# Patient Record
Sex: Female | Born: 1955 | Race: White | Hispanic: No | Marital: Single | Smoking: Former smoker
Health system: Southern US, Academic
[De-identification: ages and names within clinical notes are randomized; demographics above are authoritative.]

## PROBLEM LIST (undated history)

## (undated) DIAGNOSIS — M25562 Pain in left knee: Secondary | ICD-10-CM

## (undated) DIAGNOSIS — F1020 Alcohol dependence, uncomplicated: Secondary | ICD-10-CM

## (undated) DIAGNOSIS — M5020 Other cervical disc displacement, unspecified cervical region: Secondary | ICD-10-CM

## (undated) DIAGNOSIS — E538 Deficiency of other specified B group vitamins: Secondary | ICD-10-CM

## (undated) DIAGNOSIS — L989 Disorder of the skin and subcutaneous tissue, unspecified: Secondary | ICD-10-CM

## (undated) DIAGNOSIS — N959 Unspecified menopausal and perimenopausal disorder: Secondary | ICD-10-CM

## (undated) DIAGNOSIS — I8390 Asymptomatic varicose veins of unspecified lower extremity: Secondary | ICD-10-CM

## (undated) DIAGNOSIS — E559 Vitamin D deficiency, unspecified: Secondary | ICD-10-CM

## (undated) DIAGNOSIS — I480 Paroxysmal atrial fibrillation: Secondary | ICD-10-CM

## (undated) DIAGNOSIS — M25561 Pain in right knee: Secondary | ICD-10-CM

## (undated) DIAGNOSIS — R739 Hyperglycemia, unspecified: Secondary | ICD-10-CM

## (undated) DIAGNOSIS — F411 Generalized anxiety disorder: Secondary | ICD-10-CM

## (undated) DIAGNOSIS — G2581 Restless legs syndrome: Secondary | ICD-10-CM

## (undated) DIAGNOSIS — I10 Essential (primary) hypertension: Secondary | ICD-10-CM

## (undated) DIAGNOSIS — F32A Depression, unspecified: Secondary | ICD-10-CM

## (undated) HISTORY — PX: MOUTH SURGERY: SHX715

## (undated) HISTORY — DX: Unspecified menopausal and perimenopausal disorder: N95.9

## (undated) HISTORY — DX: Vitamin D deficiency, unspecified: E55.9

## (undated) HISTORY — DX: Restless legs syndrome: G25.81

## (undated) HISTORY — DX: Depression, unspecified: F32.A

## (undated) HISTORY — DX: Deficiency of other specified B group vitamins: E53.8

## (undated) HISTORY — DX: Other cervical disc displacement, unspecified cervical region: M50.20

## (undated) HISTORY — DX: Hypomagnesemia: E83.42

## (undated) HISTORY — PX: DENTAL SURGERY: SHX609

## (undated) HISTORY — DX: Pain in right knee: M25.561

## (undated) HISTORY — DX: Disorder of the skin and subcutaneous tissue, unspecified: L98.9

## (undated) HISTORY — DX: Alcohol dependence, uncomplicated: F10.20

## (undated) HISTORY — DX: Asymptomatic varicose veins of unspecified lower extremity: I83.90

## (undated) HISTORY — DX: Paroxysmal atrial fibrillation: I48.0

## (undated) HISTORY — DX: Generalized anxiety disorder: F41.1

## (undated) HISTORY — DX: Essential (primary) hypertension: I10

## (undated) HISTORY — DX: Hyperglycemia, unspecified: R73.9

## (undated) HISTORY — DX: Pain in left knee: M25.562

---

## 1994-08-06 ENCOUNTER — Other Ambulatory Visit (HOSPITAL_COMMUNITY): Payer: Self-pay | Admitting: OBSTETRICS/GYNECOLOGY

## 2020-02-28 IMAGING — CR XRAY LUMBAR SPINE COMPLETE INCLUDING BENDING VIEWS
1 series · 5 of 5 positions shown · non-contrast
Comparison: None available.

﻿EXAM:  XRAY LUMBAR SPINE COMPLETE INCLUDING BENDING VIEWS
INDICATION: Lower back pain.

[Series 1: view not recorded · 0.17mm/px · 5 of 5 slices shown]
[im 1/5]
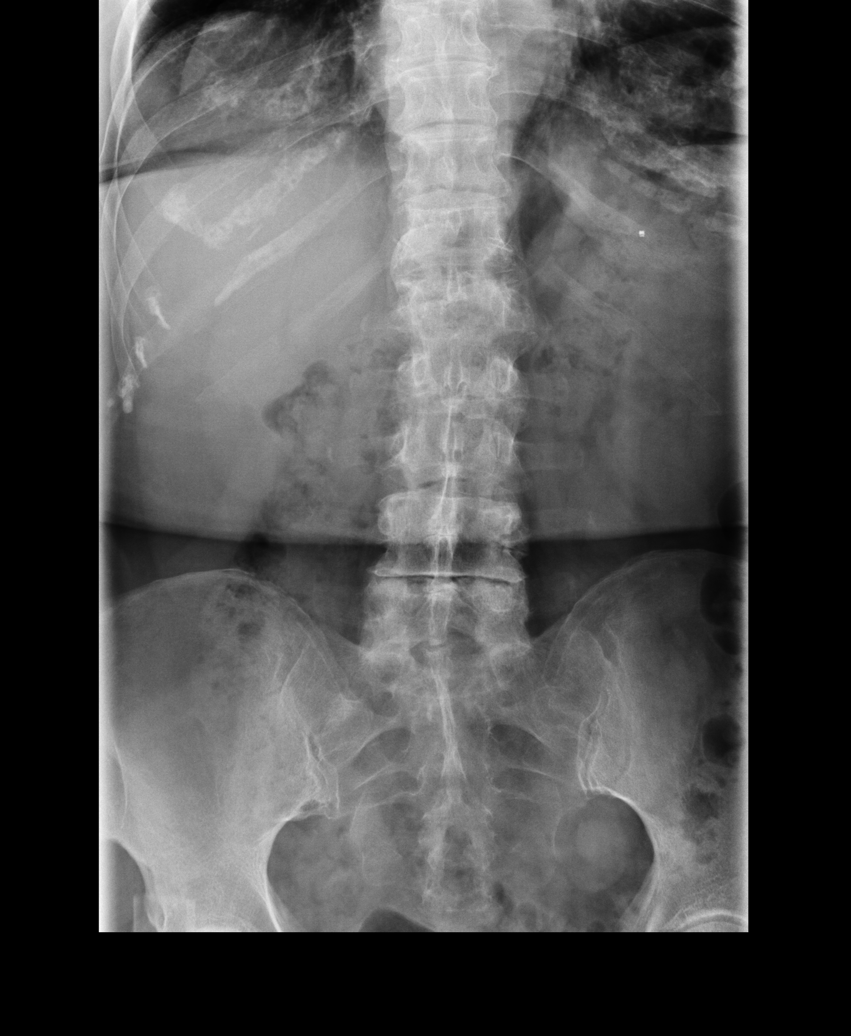
[im 2/5]
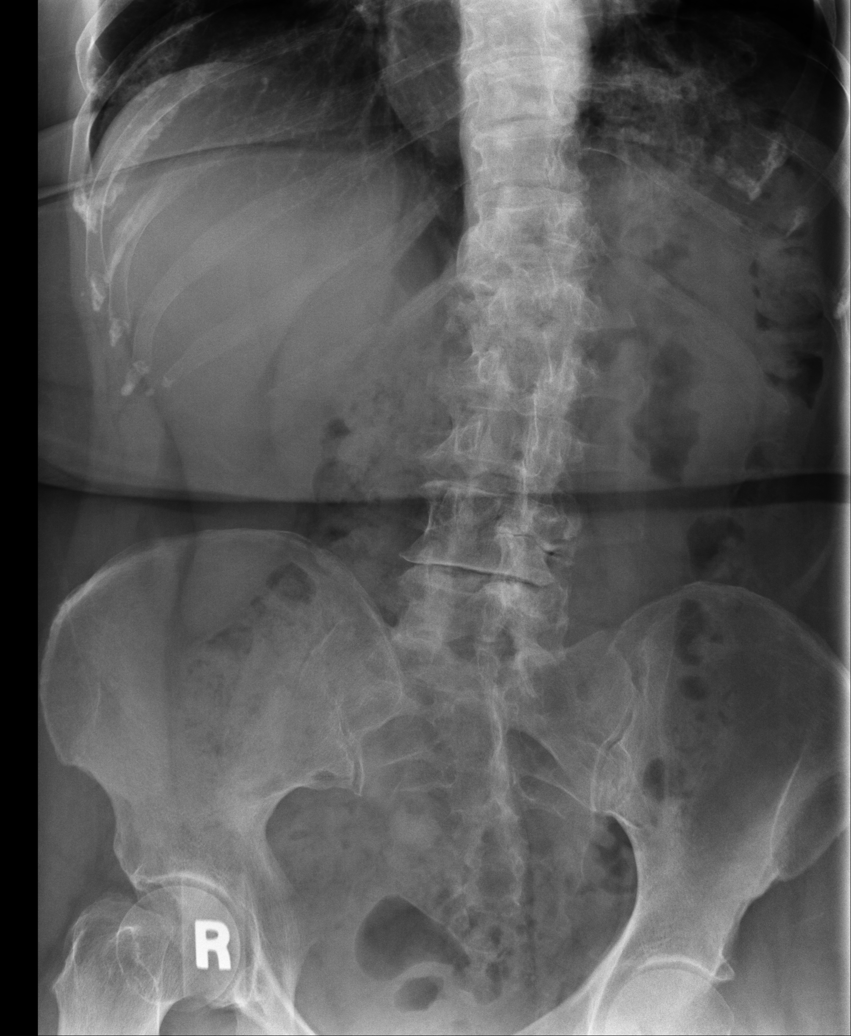
[im 3/5]
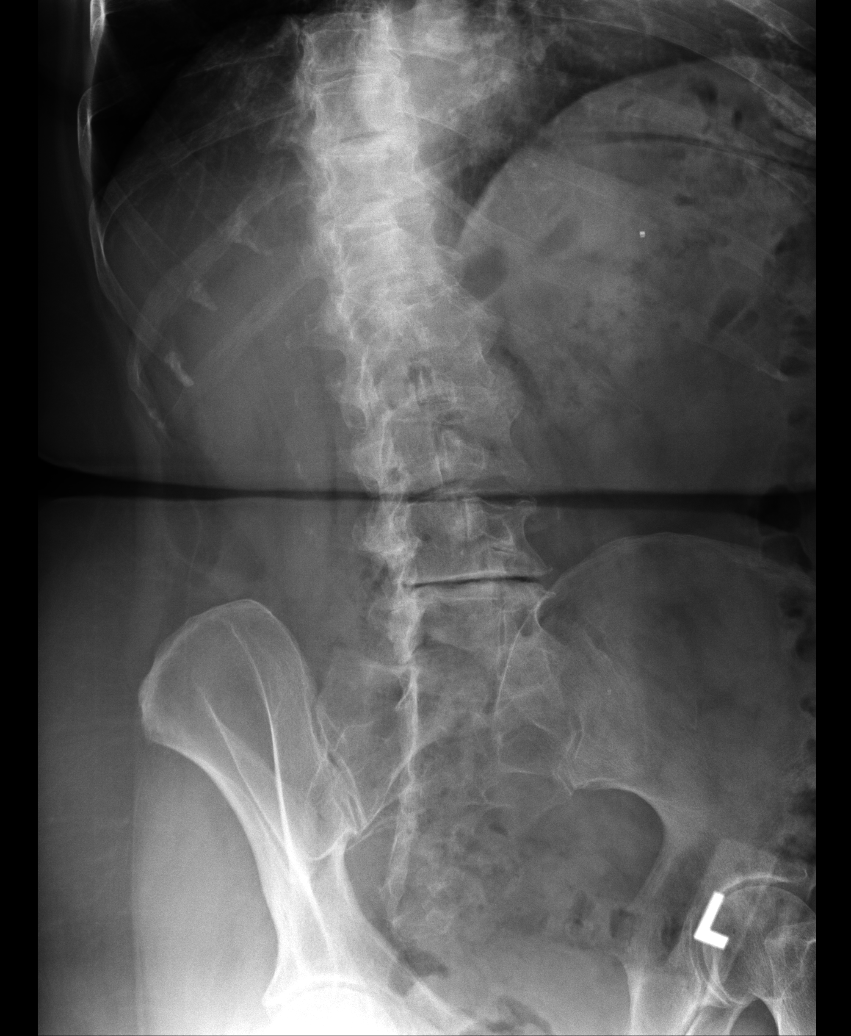
[im 4/5]
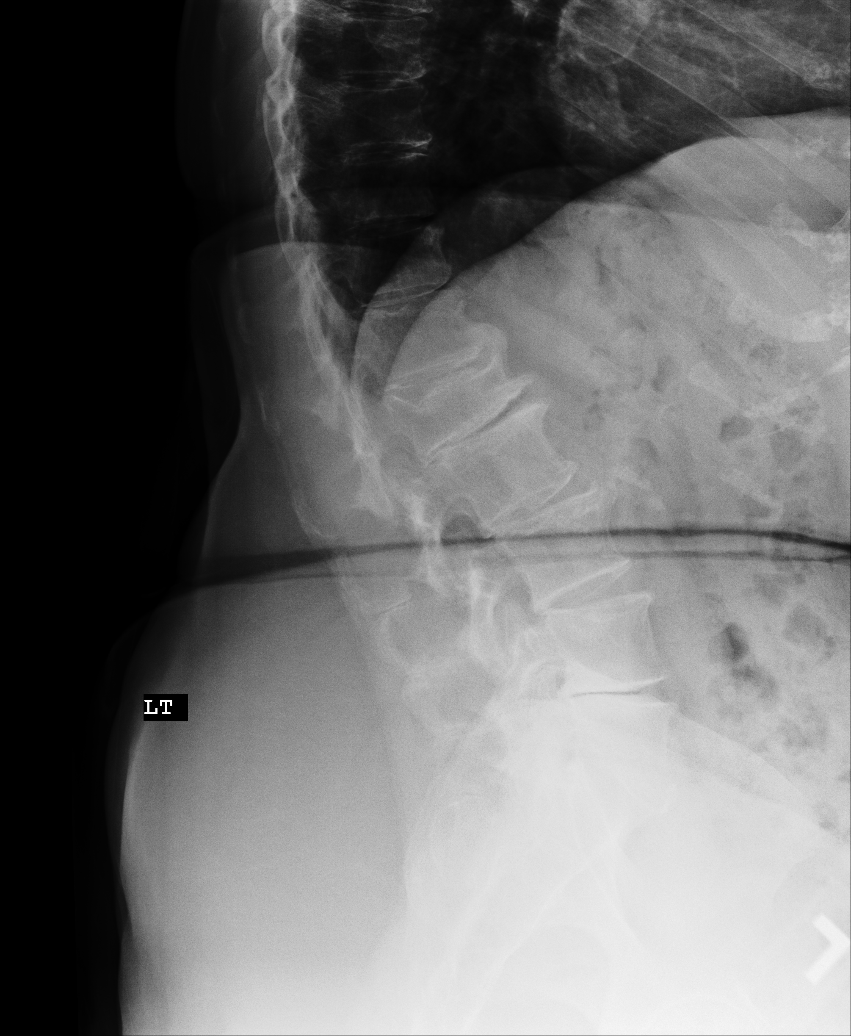
[im 5/5]
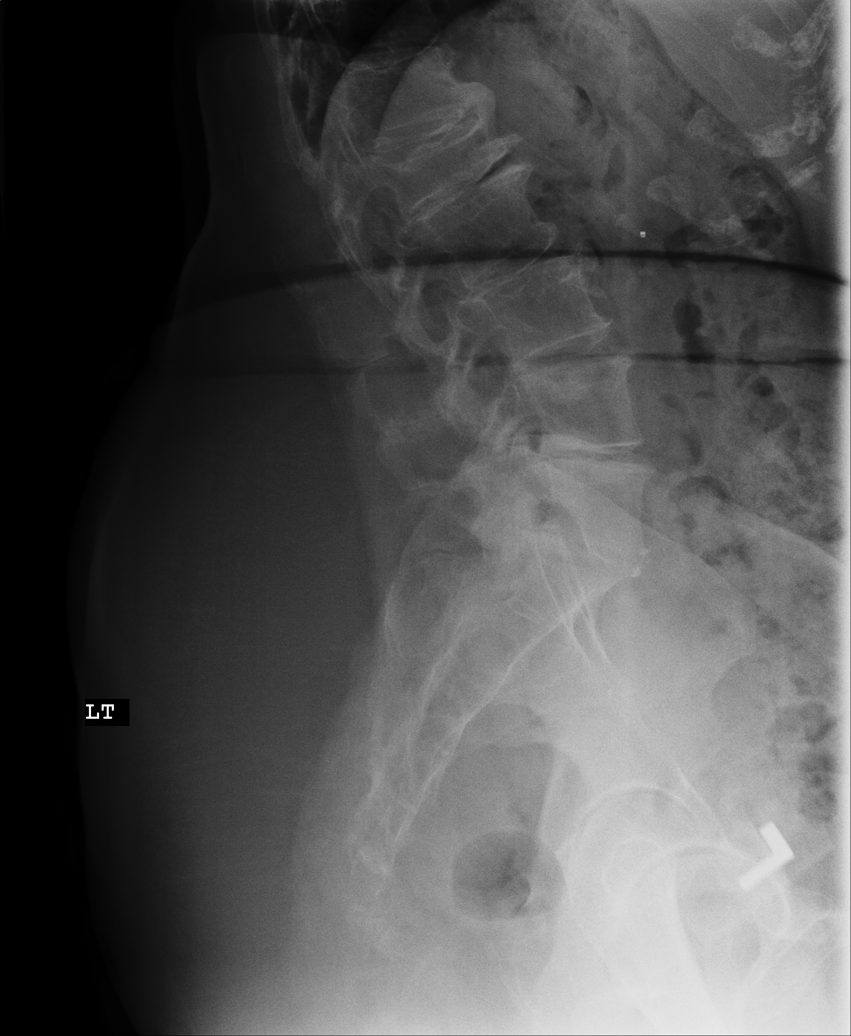

[5 of 5 positions shown; findings below may reference images not displayed]

FINDINGS: There is a moderate chronic L1 compression fracture. No acute fracture or subluxation is seen. There is severe degenerative disc disease at L1-2, L4-5 and L5-S1 levels. There is also moderate facet arthropathy within the lower lumbar spine. Paraspinal soft tissues are unremarkable.
IMPRESSION: Multilevel degenerative changes and chronic L1 compression fracture.

## 2020-02-28 IMAGING — CR XRAY THORACIC SPINE 2 VIEWS
1 series · 3 of 3 positions shown · non-contrast
Comparison: None available.

﻿EXAM:  14414 - X-RAY EXAM THORAC SPINE 2VWS
INDICATION: Back pain.

[Series 1: view not recorded · 0.17mm/px · 3 of 3 slices shown]
[im 1/3]
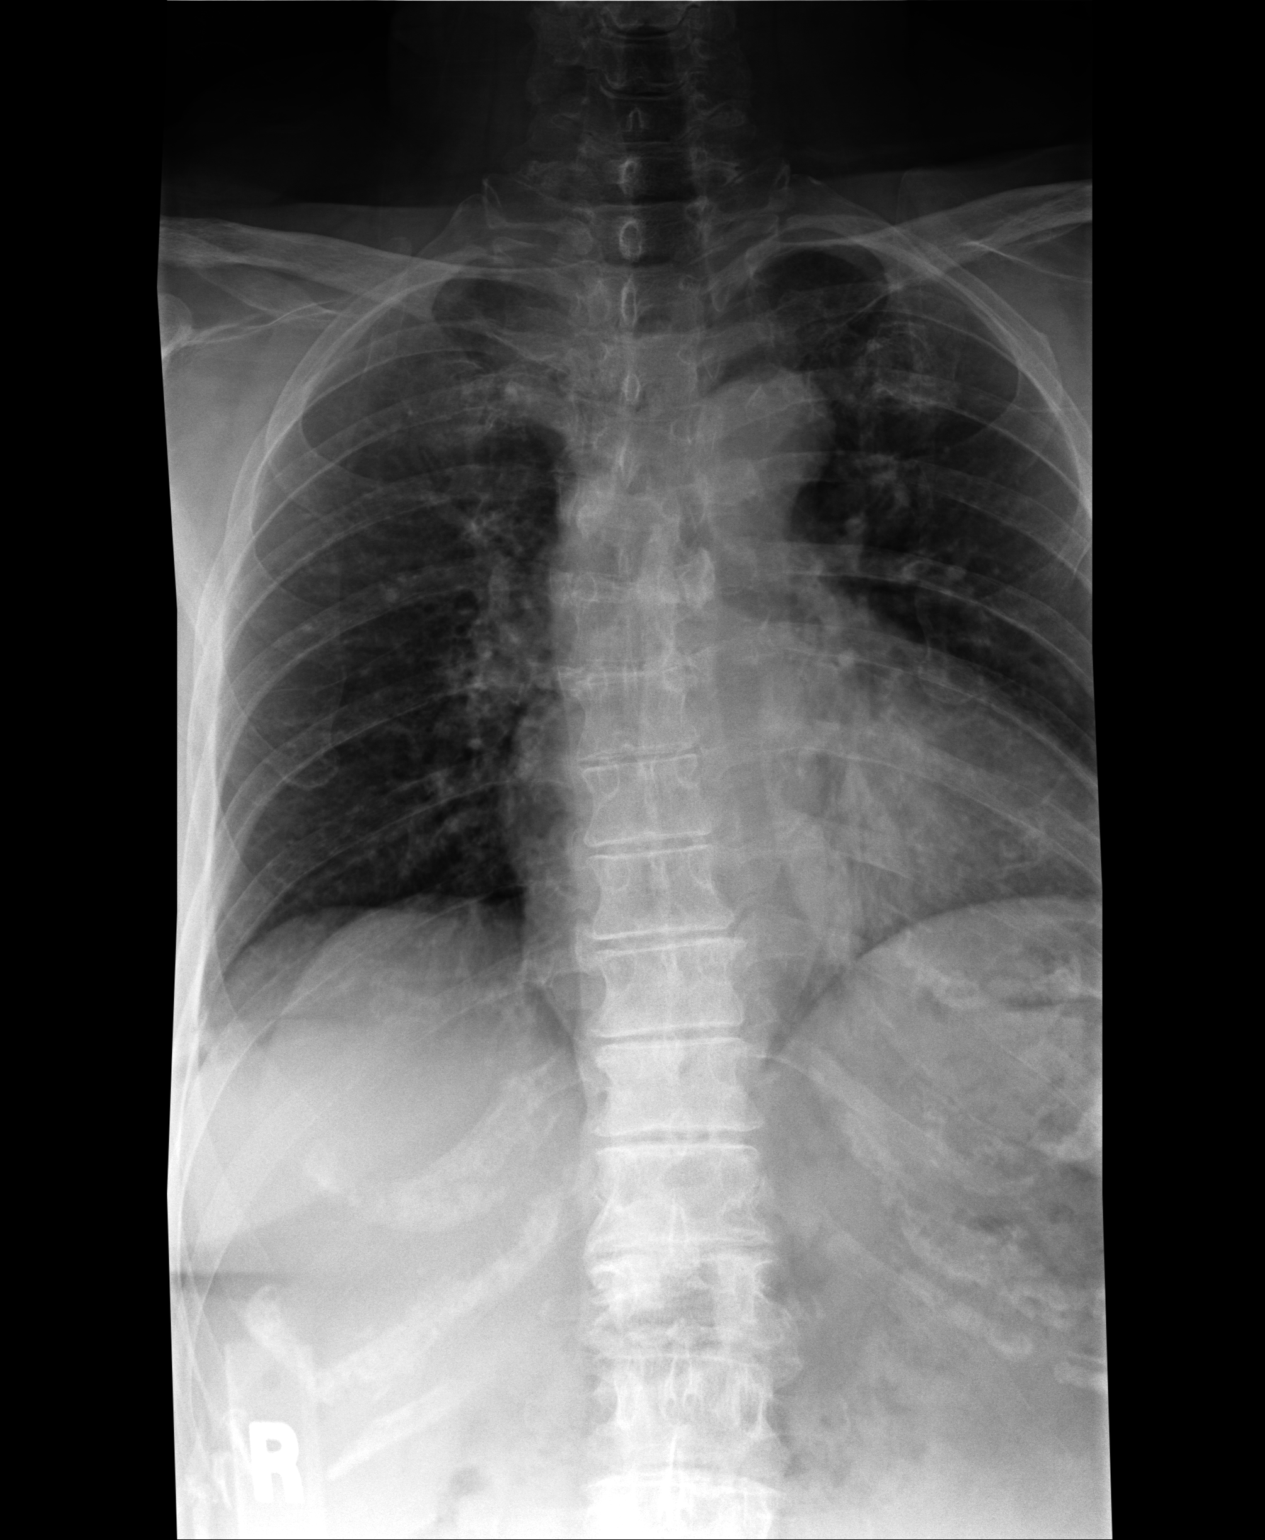
[im 2/3]
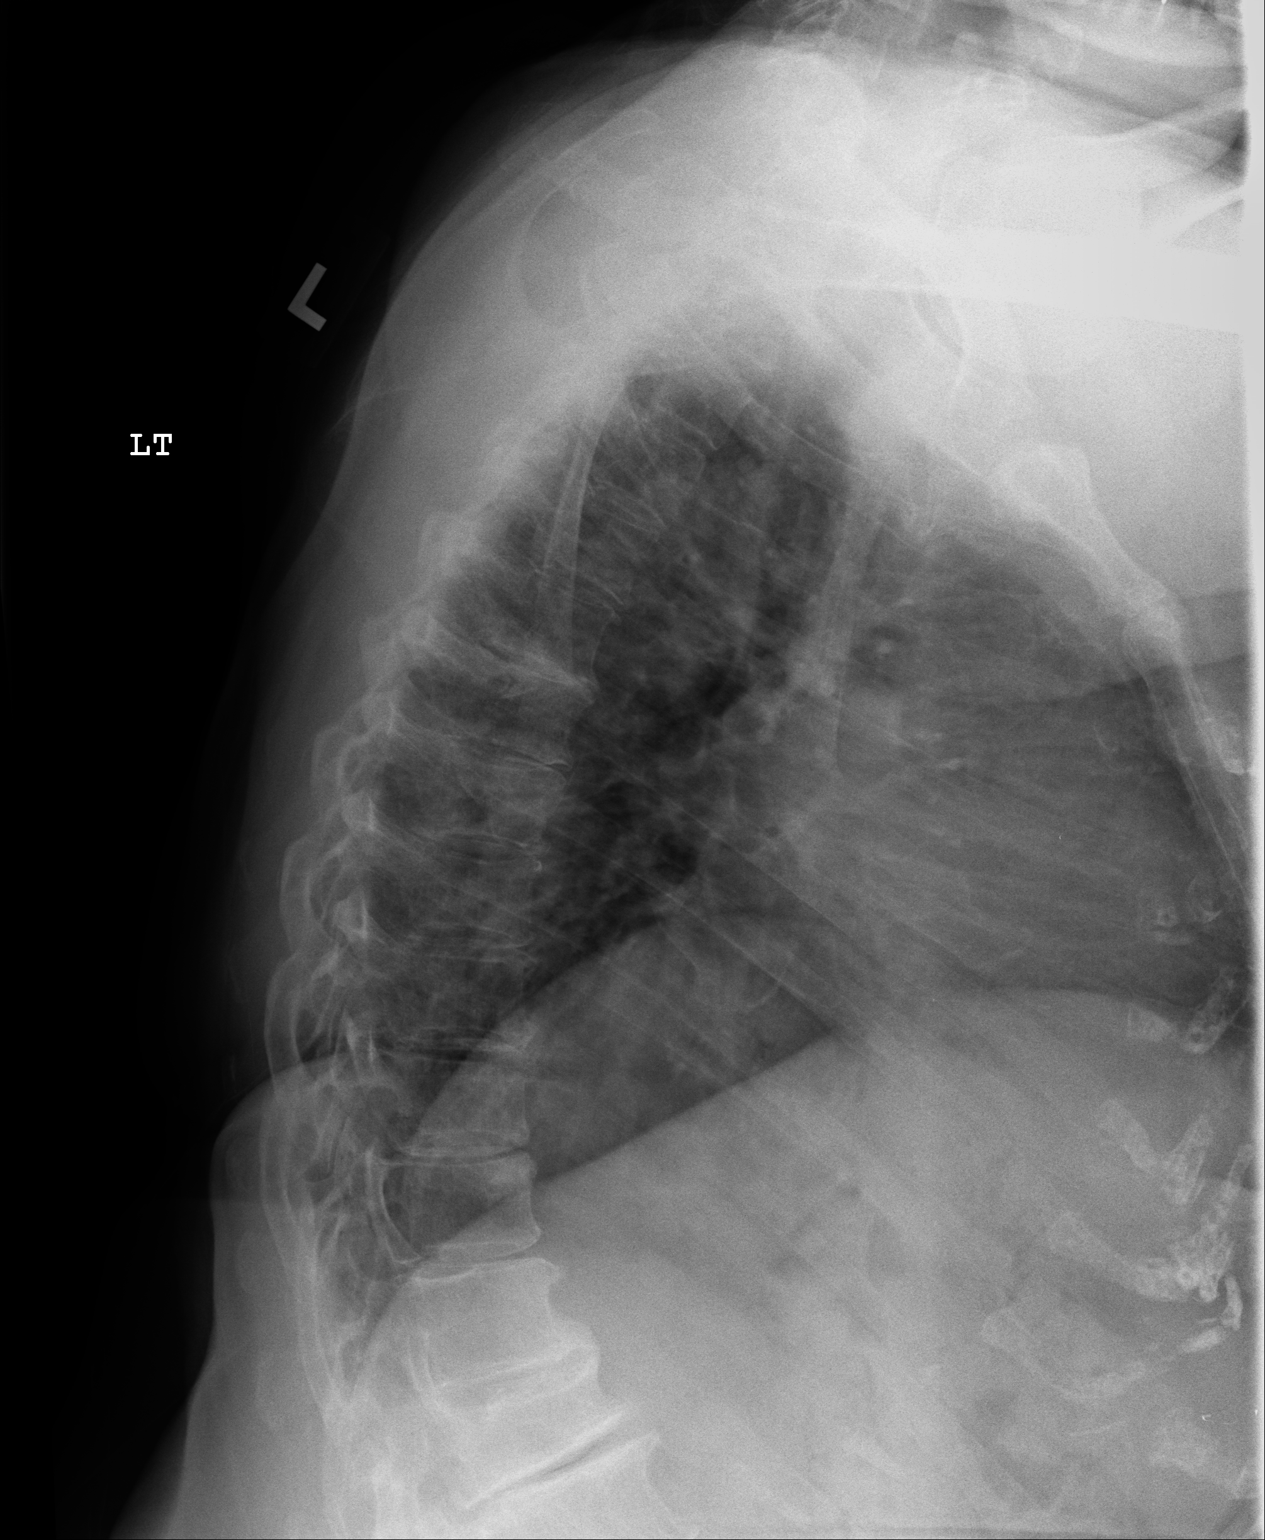
[im 3/3]
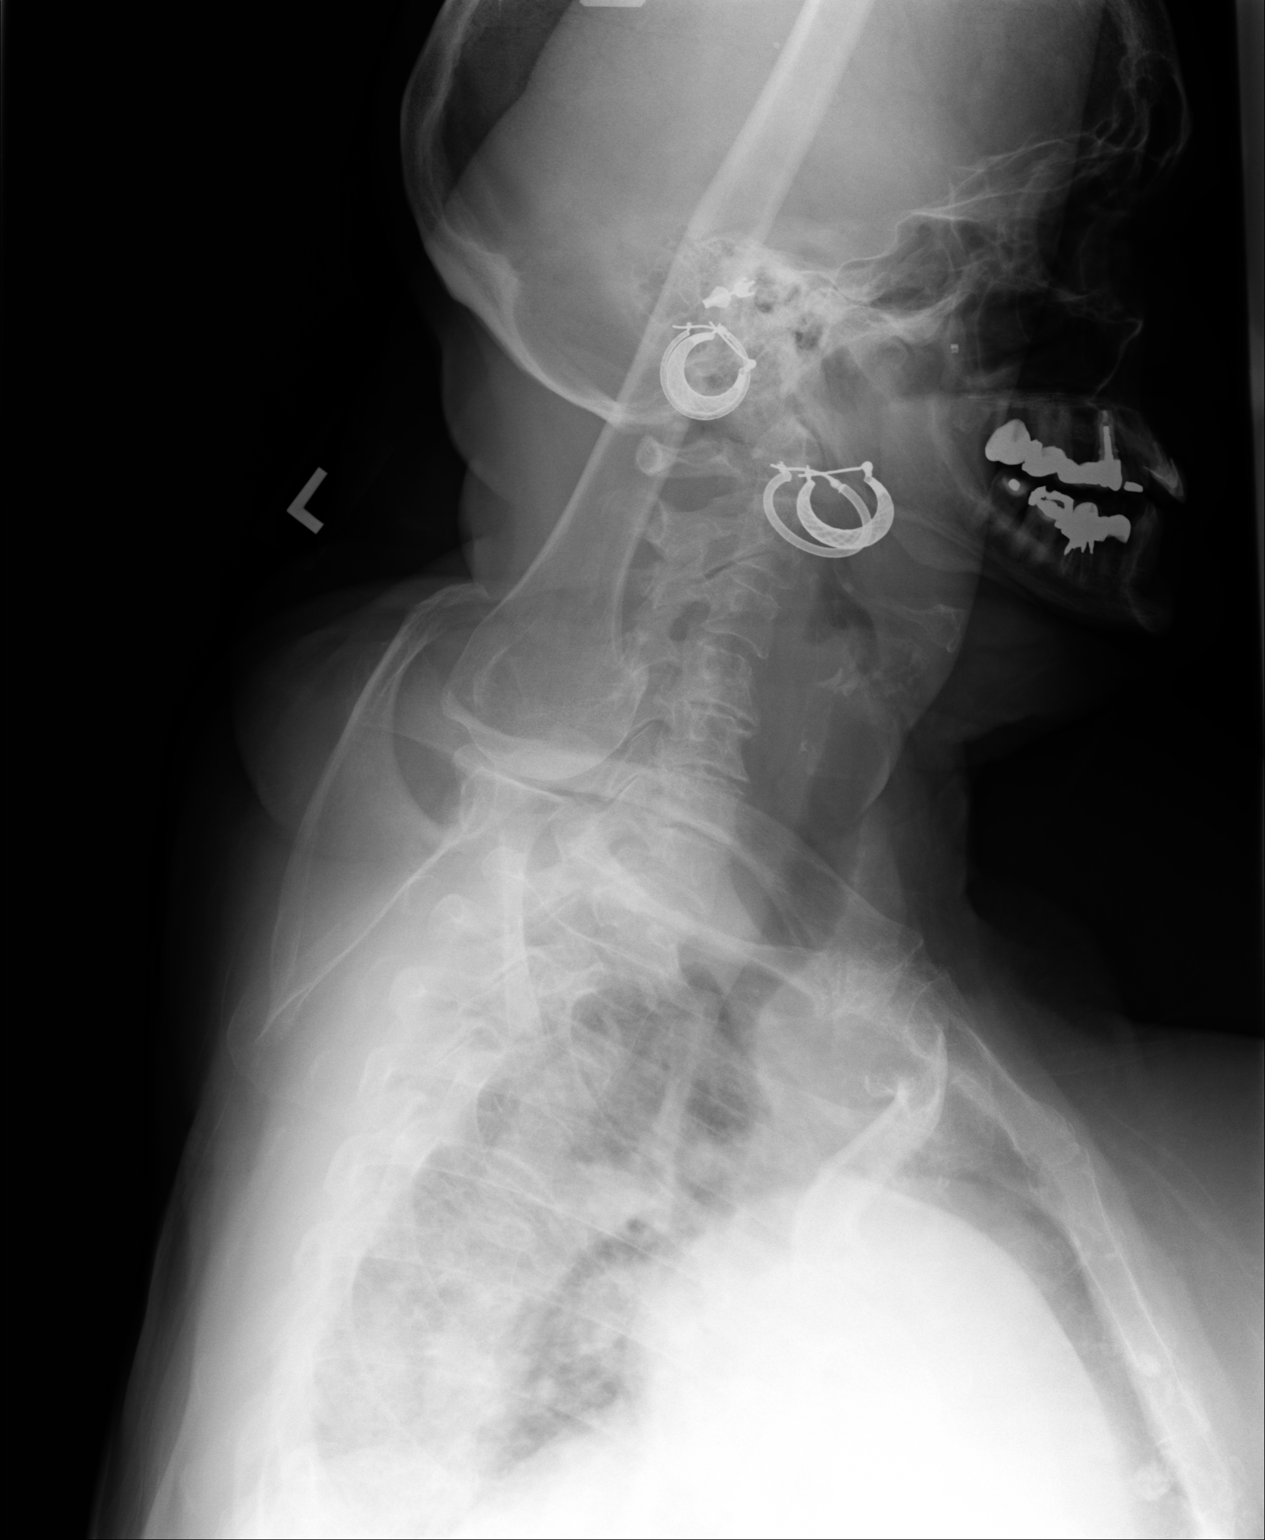

[3 of 3 positions shown; findings below may reference images not displayed]

FINDINGS: Vertebral bodies are normal in height and alignment. There is no acute fracture or subluxation. There is moderate to severe degenerative disc disease at T5-6 and T12-L1 levels. Paraspinal soft tissues are unremarkable. Visualized lung fields are clear.
IMPRESSION: Multilevel degenerative changes as detailed above.

## 2020-04-23 IMAGING — MG 3D SCREENING MAMMO BIL W/CAD
5 series · 7 of 24 positions shown · non-contrast
Comparison: 05/11/2021

------------- REPORT [id number] -------------
﻿

A-JAY, SHITONGENI
EXAM:  3D BILATERAL ANNUAL SCREENING DIGITAL MAMMOGRAM WITH CAD AND TOMOSYNTHESIS
INDICATION: Screening.

[R]
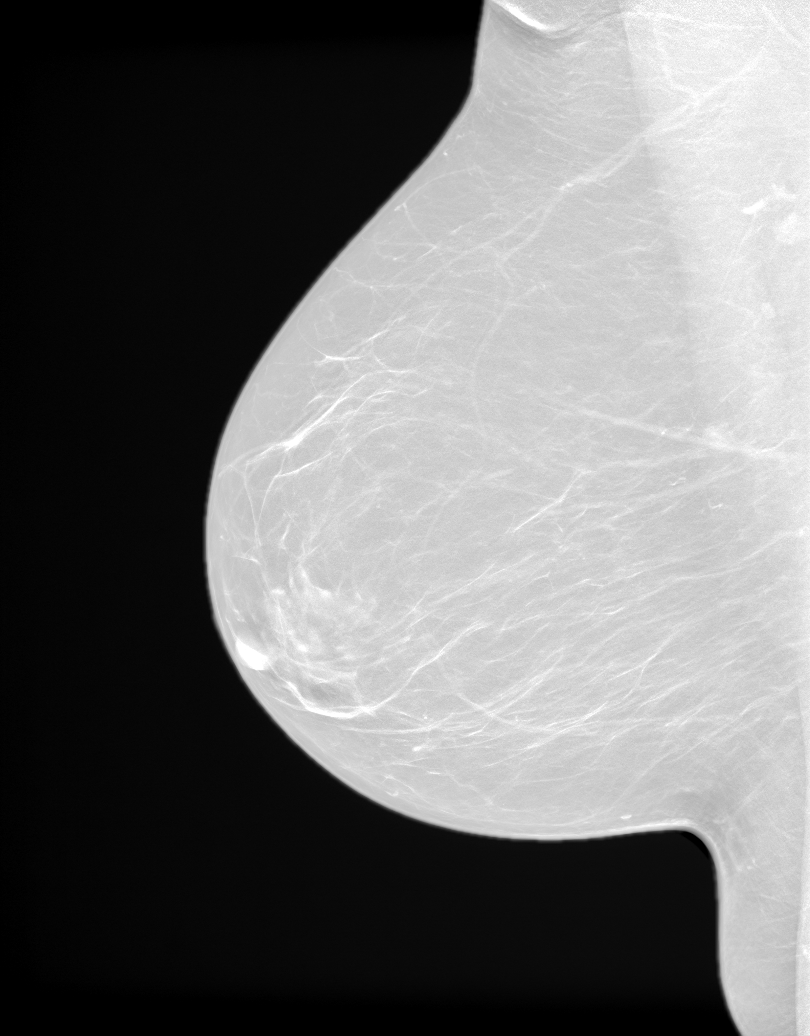

[L]
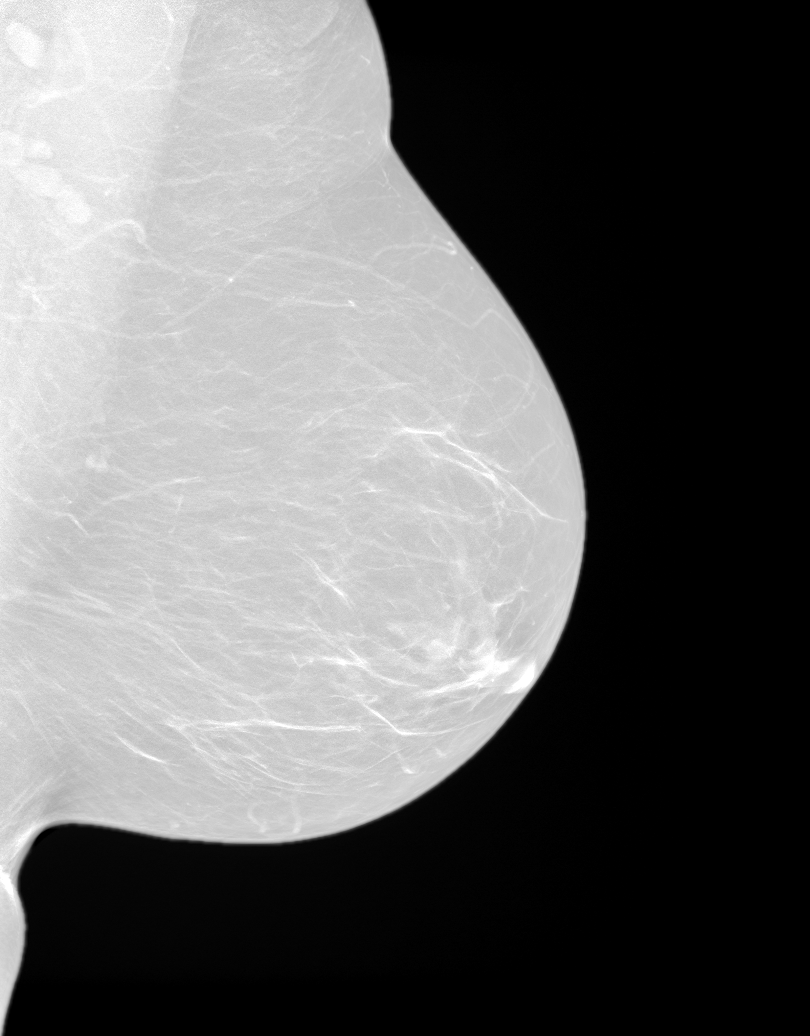

[Series 7611: R CC · right · 0.10mm/px · 2 of 2 slices shown]
[im 1/2]
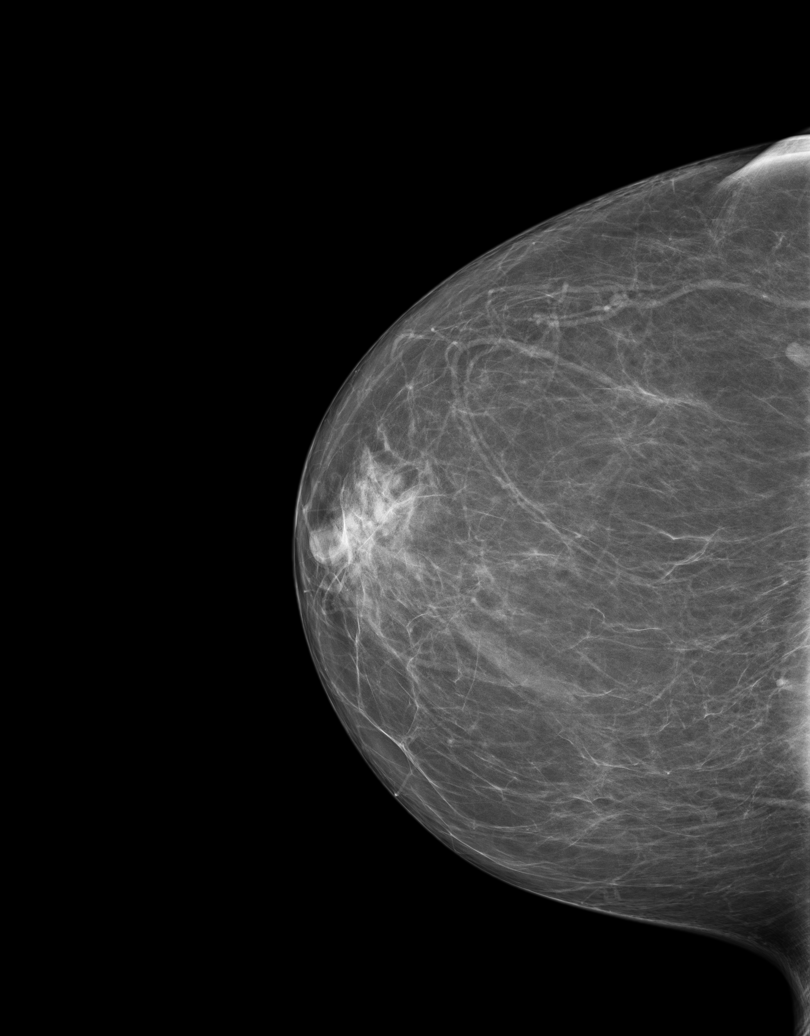
[im 2/2]
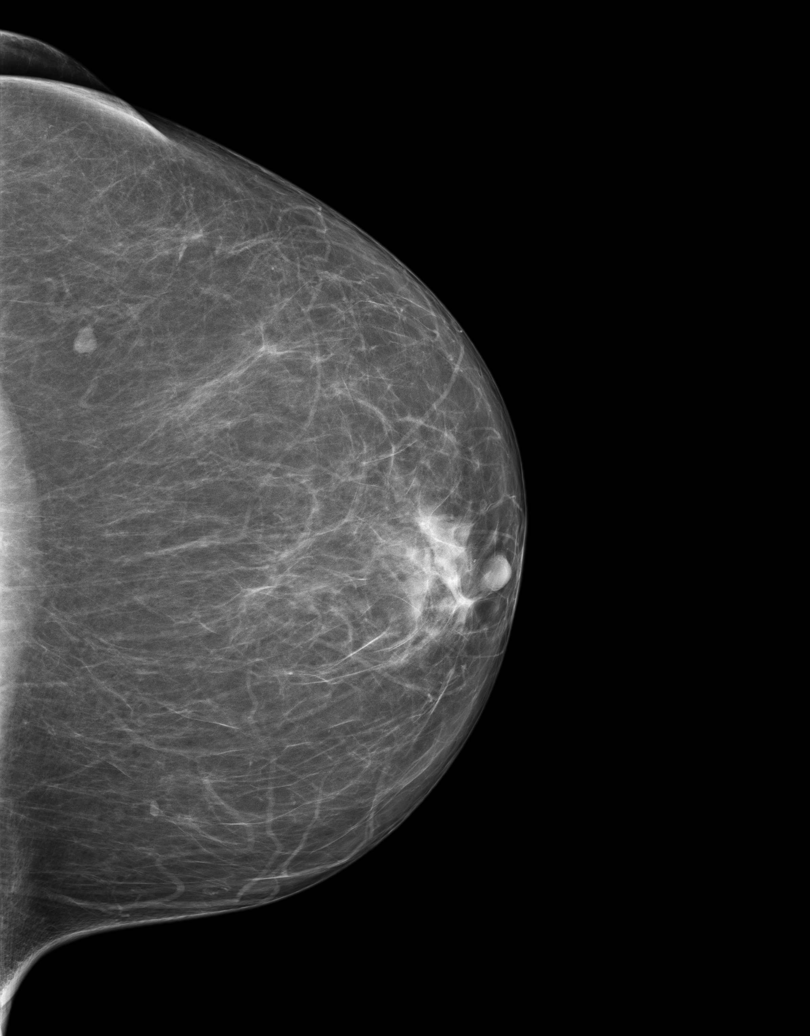

[Series 7613: 3D SCREENING MAMMO BIL W/CAD · 2 acquisitions, 2 frames shown (1 of 2)]
[im 1/2]
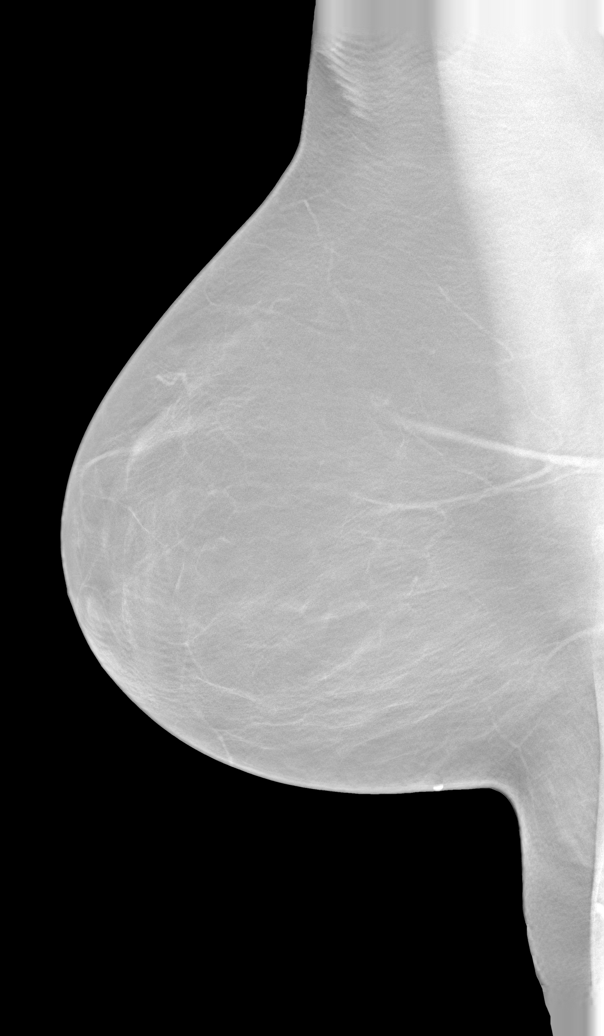
[im 2/2]
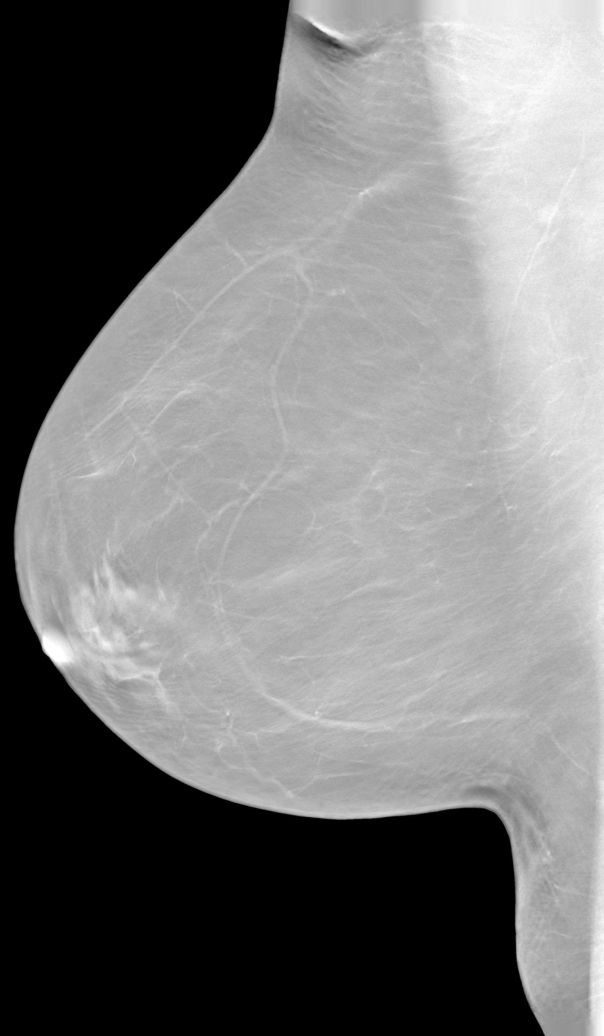

[3D SCREENING MAMMO BIL W/CAD (2 of 2) · tomo slice 13/78.0]
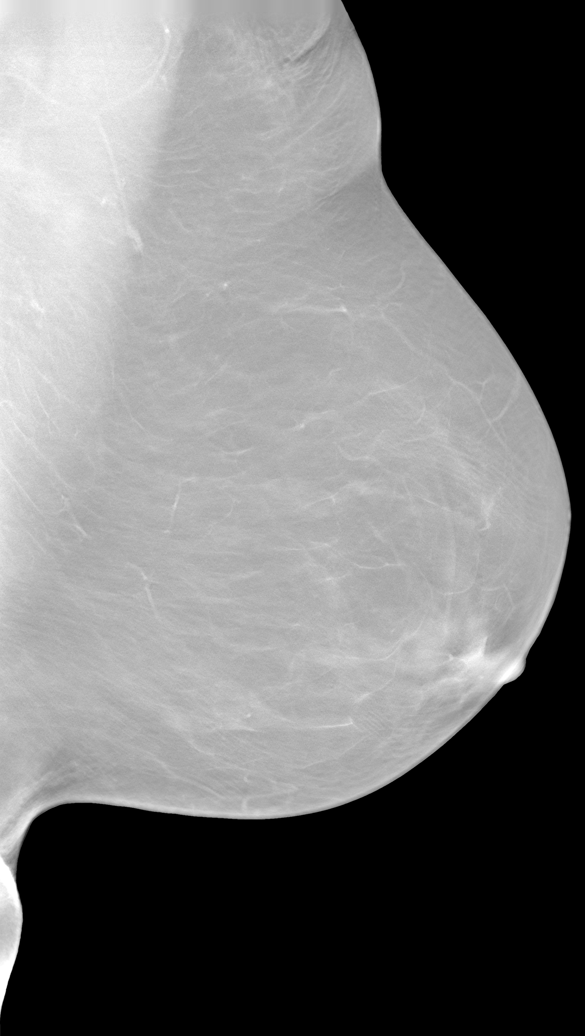

[7 of 24 positions shown; findings below may reference images not displayed]

FINDINGS: There are scattered fibroglandular elements.  There is no mass or suspicious cluster of microcalcifications.   There is no architectural distortion, skin thickening or nipple retraction.
IMPRESSION: 1.  BIRADS 2-Benign findings. Patient has been added in a reminder system with a target date for the next screening mammography.

2.  DENSITY CODE – B (Scattered areas of fibroglandular density). 

Final Assessment Code:

Bi-Rads 2 

BI-RADS 0
 Need additional imaging evaluation.

BI-RADS 1
 Negative mammogram.

BI-RADS 2
 Benign finding.

BI-RADS 3
 Probably benign finding; short-interval follow-up suggested.

BI-RADS 4
 Suspicious abnormality; biopsy should be considered.

BI-RADS 5
 Highly suggestive of malignancy; appropriate action should be taken.

BI-RADS 6
 Known biopsy-proven malignancy; appropriate action should be taken.

NOTE:
In compliance with Federal regulations, the results of this mammogram are being sent to the patient.

------------- REPORT [id number] -------------
[facility]
[address]
Tiger Ms.TARIN, DASEAN:
We wish to report the following on your recent mammography examination. We are sending a report to your referring physician or other health care provider. 
(       Normal/Negative:
No evidence of cancer.
This statement is mandated by the [place], Department of Health.
Your examination was performed by one of our technologists, who are registered radiological technologists and also specially certified in mammography:
___
Markland, Marjuan (M)

Your mammogram was interpreted by our radiologist.

( 
Collette Sedman, M.D.

(Annual Breast Examination by a physician or other health care provider
(Annual Mammography Screening beginning at age 40
(Monthly Breast Self Examination

## 2021-04-03 ENCOUNTER — Other Ambulatory Visit (RURAL_HEALTH_CENTER): Payer: Self-pay | Admitting: Internal Medicine

## 2021-04-03 DIAGNOSIS — Z1231 Encounter for screening mammogram for malignant neoplasm of breast: Secondary | ICD-10-CM

## 2021-06-11 ENCOUNTER — Other Ambulatory Visit (HOSPITAL_COMMUNITY): Payer: Self-pay | Admitting: Orthopaedic Surgery

## 2021-06-19 ENCOUNTER — Other Ambulatory Visit (RURAL_HEALTH_CENTER): Payer: Self-pay | Admitting: Internal Medicine

## 2021-06-19 IMAGING — MR MRI KNEE LT W/O CONTRAST
5 series · 40 of 40 positions shown · IV contrast (gadolinium)
Comparison: Radiographs from outside facility dated 03/11/2021.

﻿EXAM:  [id number]   MRI KNEE LT W/O CONTRAST
INDICATION: Chronic pain.
TECHNIQUE: Multiplanar multisequential MRI of the left knee joint was performed without gadolinium contrast.

[Series 6: PD fat-sat · axial · left · 4.0mm · 0.56mm/px · z∈[-48,+82]mm · 8 of 30 slices shown (1 of 3)]
[im 1/30]
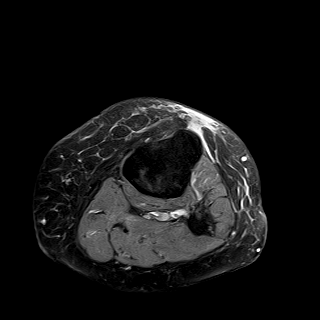
[im 5/30]
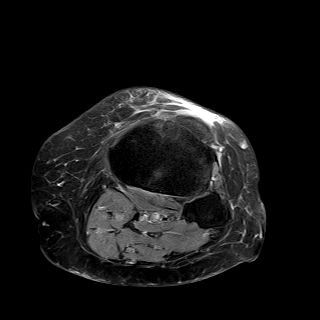
[im 9/30]
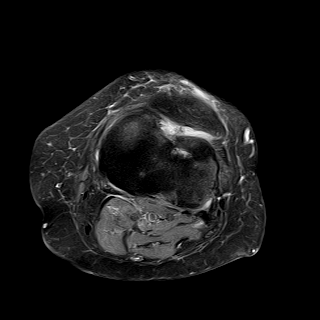
[im 13/30]
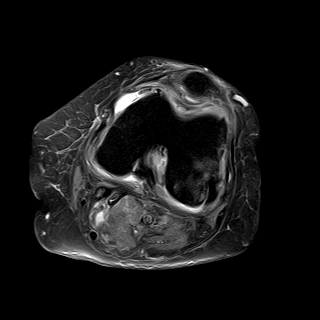
[im 17/30]
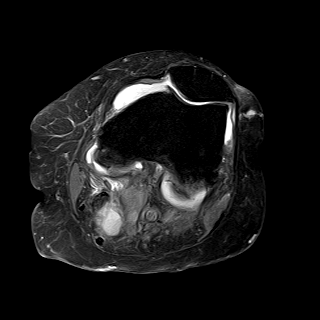
[im 21/30]
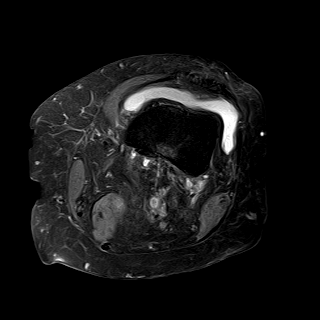
[im 25/30]
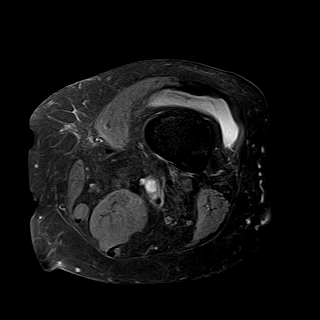
[im 30/30]
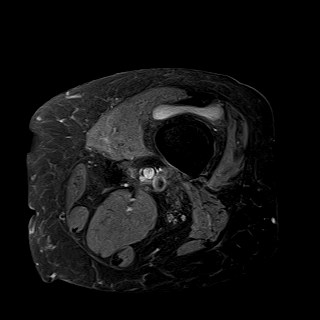

[Series 7: PD fat-sat · sagittal · left · 3.0mm · 0.53mm/px · 9 of 30 slices shown (2 of 3)]
[im 1/30]
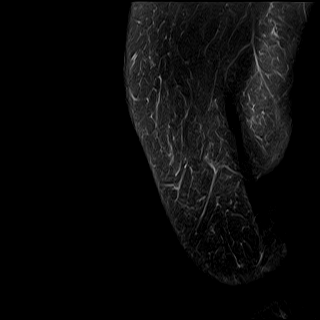
[im 4/30]
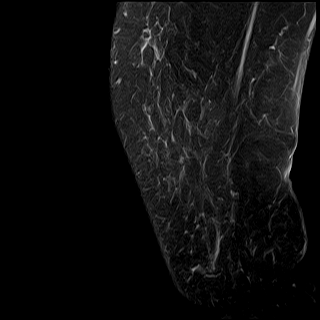
[im 8/30]
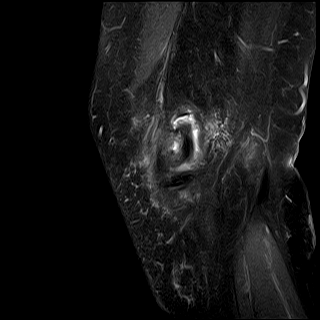
[im 11/30]
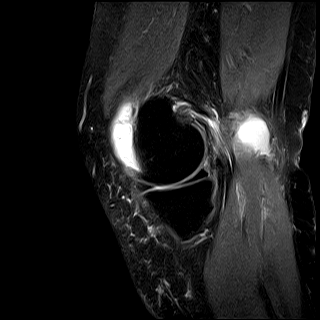
[im 15/30]
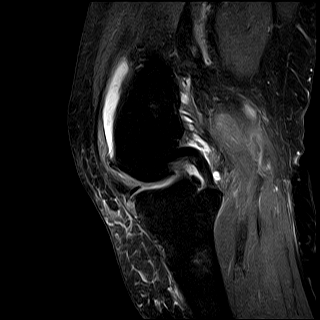
[im 19/30]
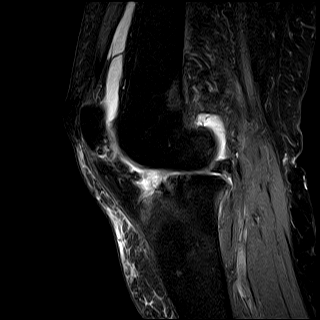
[im 22/30]
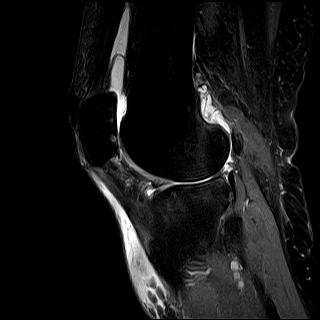
[im 26/30]
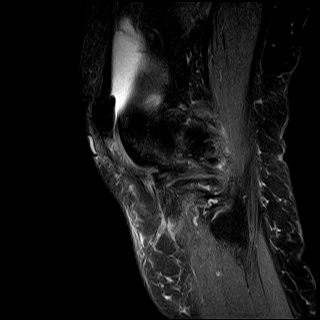
[im 30/30]
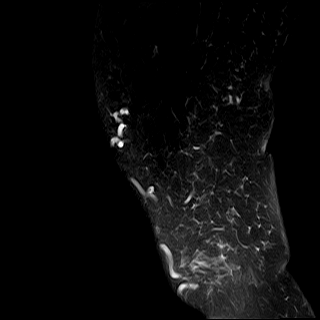

[Series 8: T1 · sagittal · left · 3.0mm · 0.44mm/px · 9 of 30 slices shown]
[im 1/30]
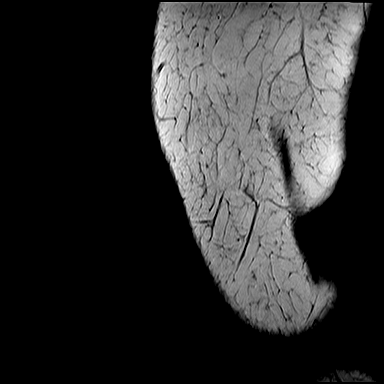
[im 4/30]
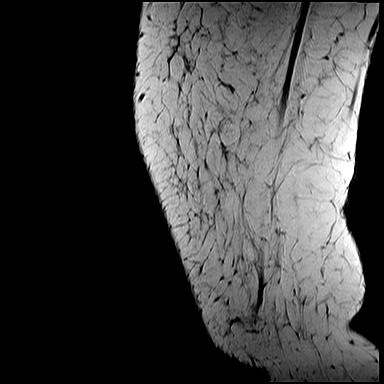
[im 8/30]
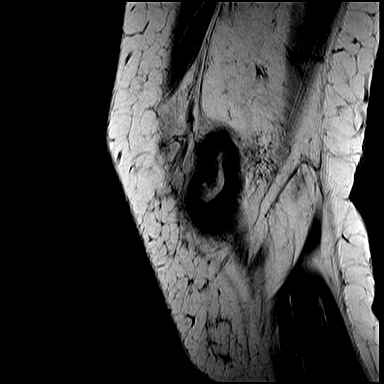
[im 11/30]
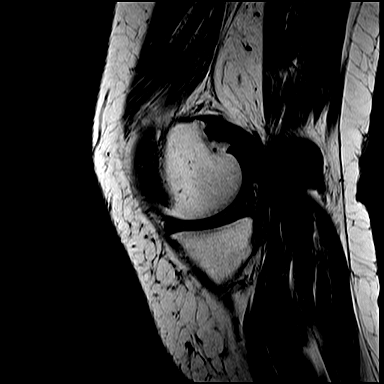
[im 15/30]
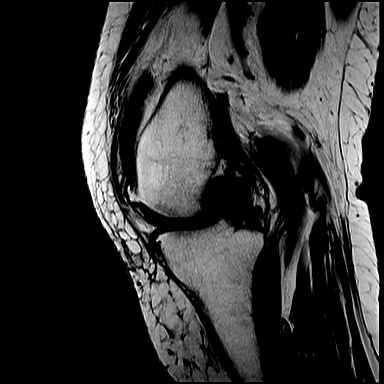
[im 19/30]
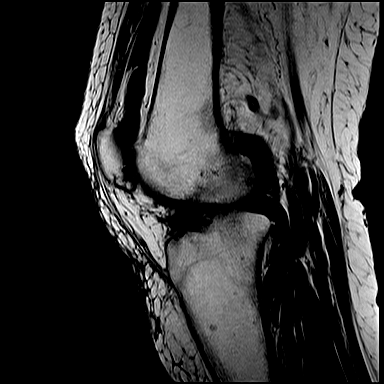
[im 22/30]
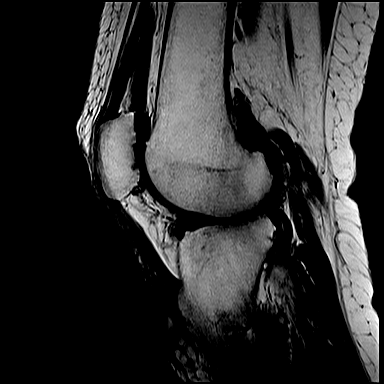
[im 26/30]
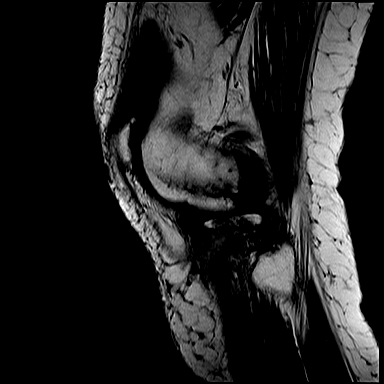
[im 30/30]
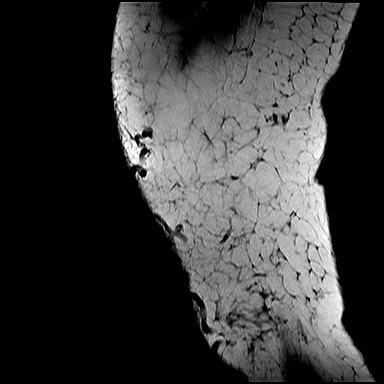

[Series 9: STIR · coronal · left · 3.5mm · 0.47mm/px · 7 of 25 slices shown]
[im 1/25]
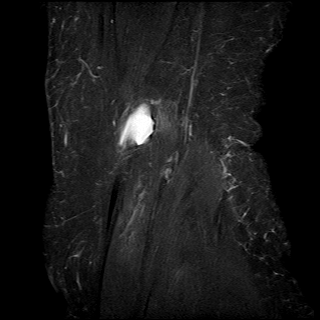
[im 5/25]
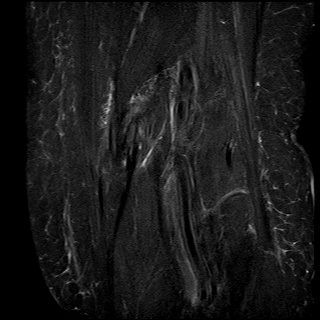
[im 9/25]
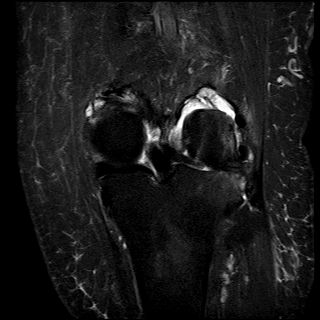
[im 13/25]
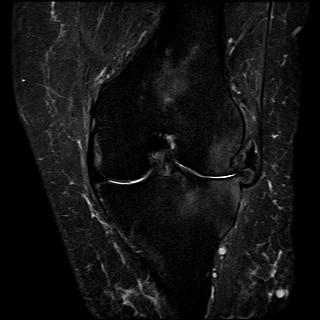
[im 17/25]
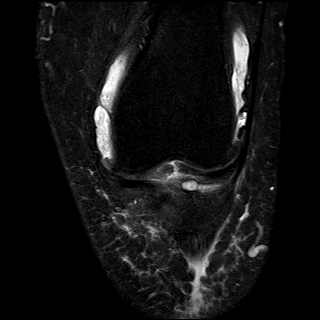
[im 21/25]
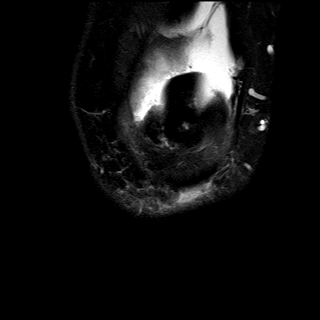
[im 25/25]
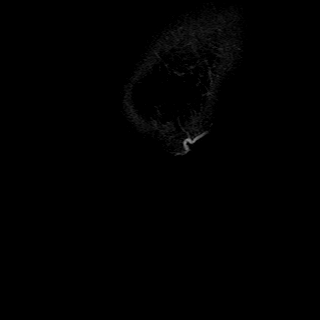

[Series 10: PD fat-sat · coronal · left · 3.5mm · 0.47mm/px · 7 of 25 slices shown (3 of 3)]
[im 1/25]
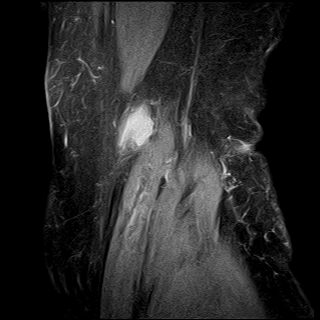
[im 5/25]
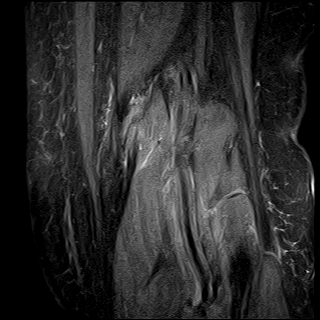
[im 9/25]
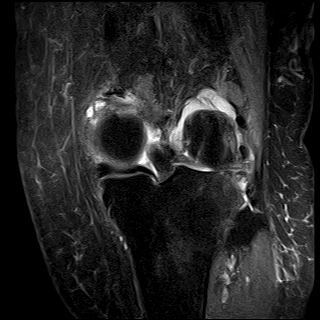
[im 13/25]
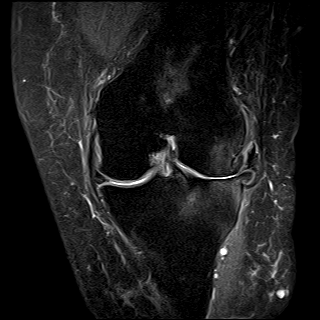
[im 17/25]
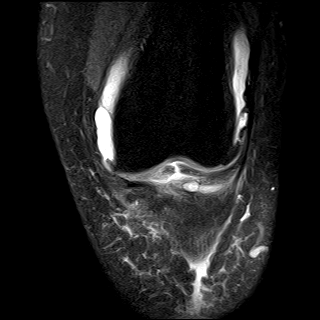
[im 21/25]
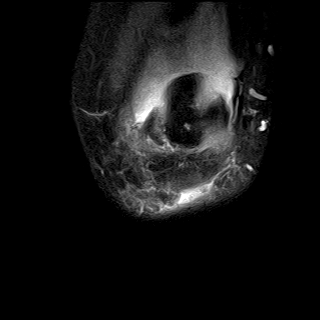
[im 25/25]
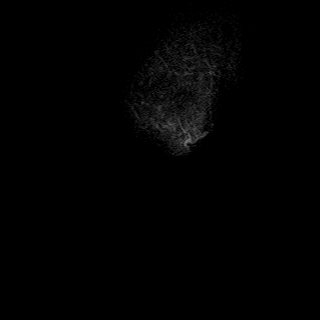

[40 of 40 positions shown; findings below may reference images not displayed]

FINDINGS: There is an extensive lateral meniscus tear.  There is also a horizontal tear involving the body of the medial meniscus.  There is also significant thinning of the anterior cruciate ligament.  Posterior cruciate and collateral ligaments appear intact.  There is grade 4 chondromalacia of the lateral tibiofemoral articulation and grade 3 chondromalacia patella. Extensor mechanism is intact. Capsular attachments appear unremarkable. Bone marrow signal intensity is normal. There is a moderate joint effusion.  A small Baker's cyst is also seen. There is also significant pretibial subcutaneous edema.
IMPRESSION: 1. Extensive lateral meniscus tear with grade 4 chondromalacia of the lateral tibiofemoral articulation.  

2. Horizontal tear involving the body of the medial meniscus. 

3. Significant thinning of the anterior cruciate ligament.

## 2021-06-19 IMAGING — MG 3D SCREENING MAMMO BIL AND TOMO
5 series · 7 of 24 positions shown · non-contrast
Comparison: 12/04/2022

------------- REPORT [id number] -------------
﻿

KAHLER, MIRSA
EXAM:  3D BILATERAL ANNUAL SCREENING DIGITAL MAMMOGRAM WITH TOMOSYNTHESIS
INDICATION: Screening.

[L]
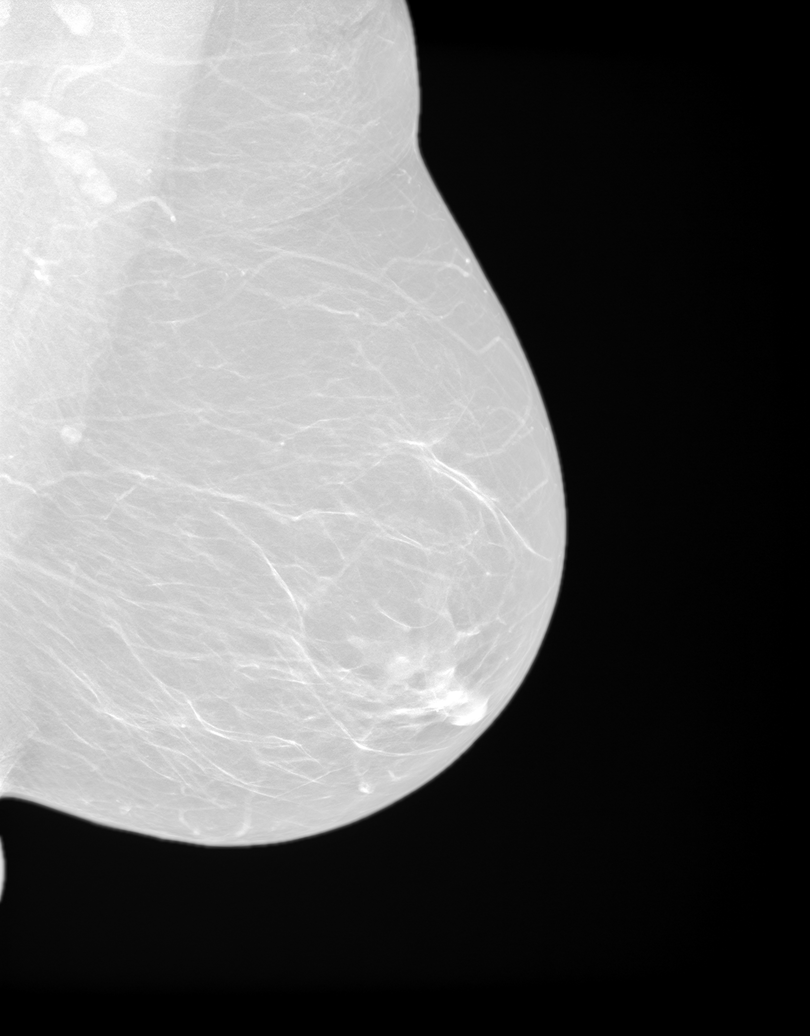

[R]
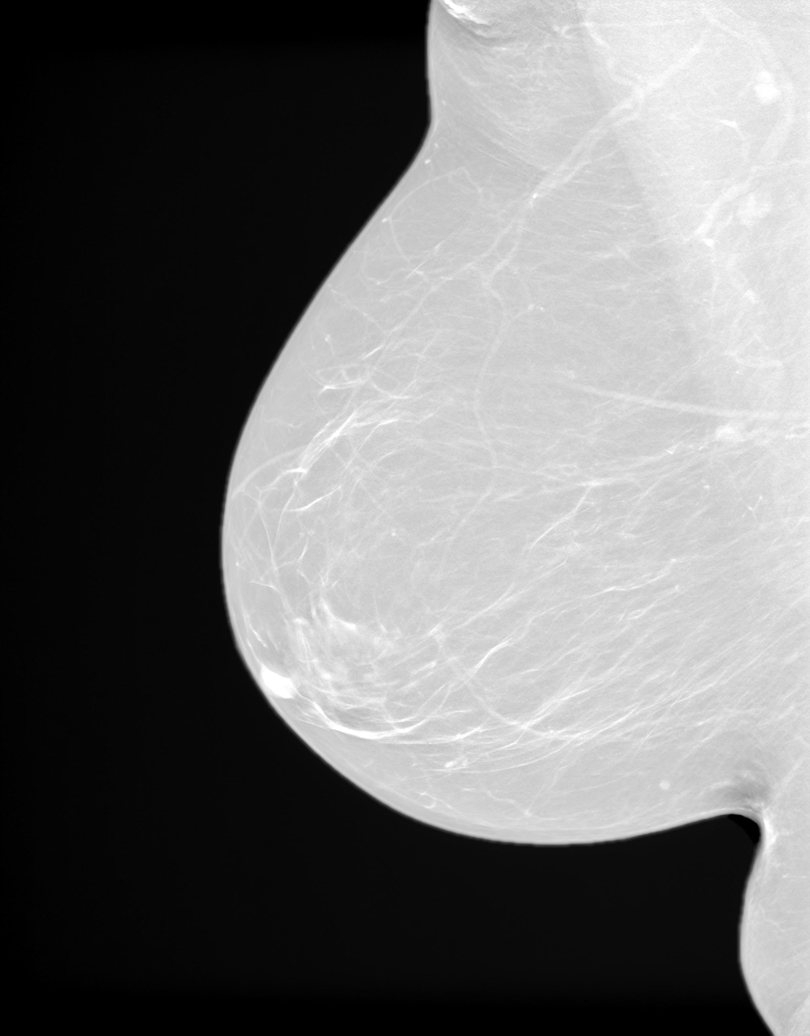

[R CC tomo · right · 0.10mm/px · 2 of 2 slices shown]
[im 1/2]
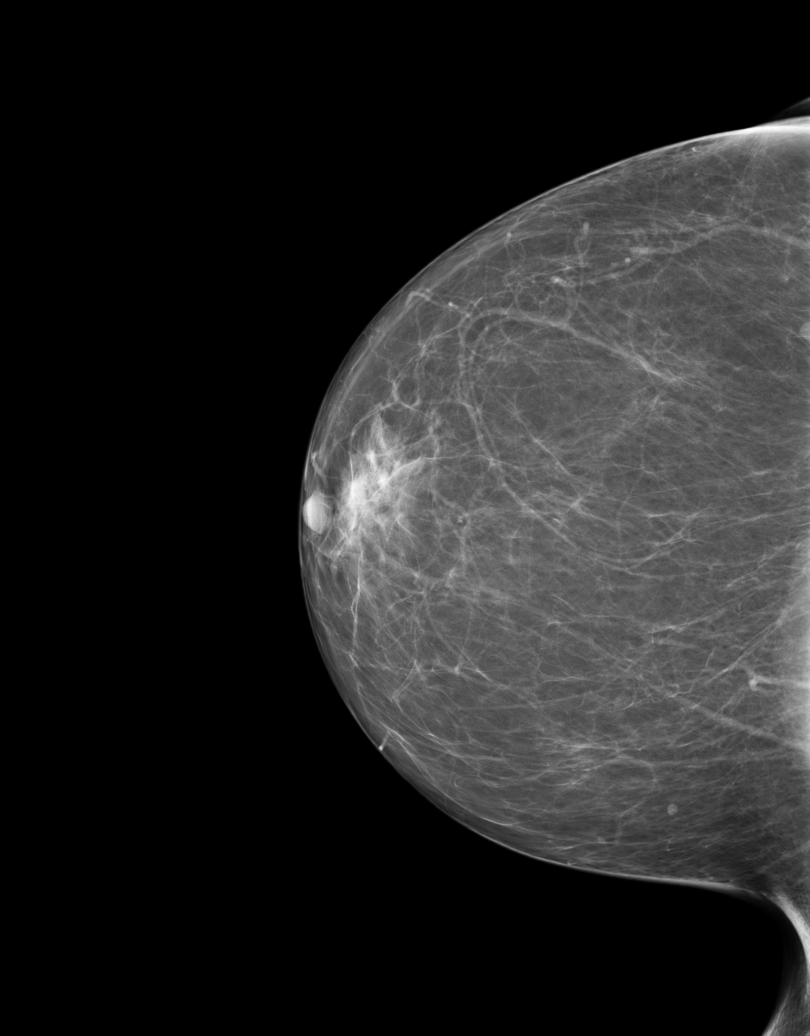
[im 2/2]
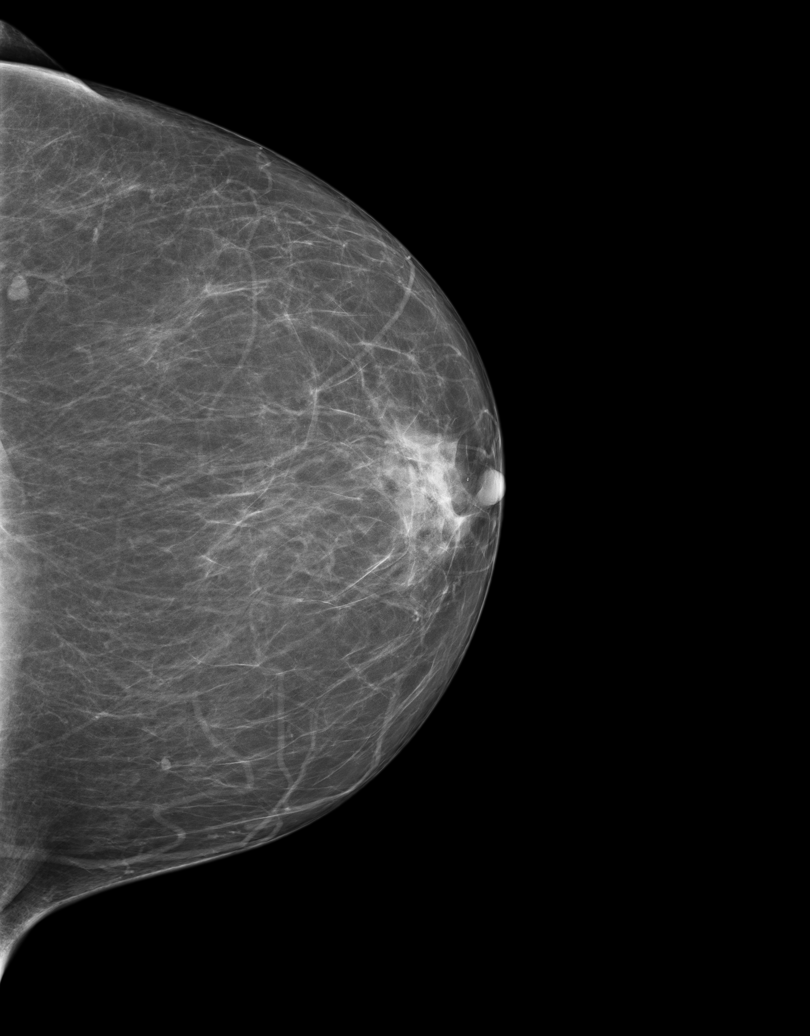

[3D SCREENING MAMMO BIL AND TOMO tomo · 2 acquisitions, 2 frames shown (1 of 2)]
[im 1/2]
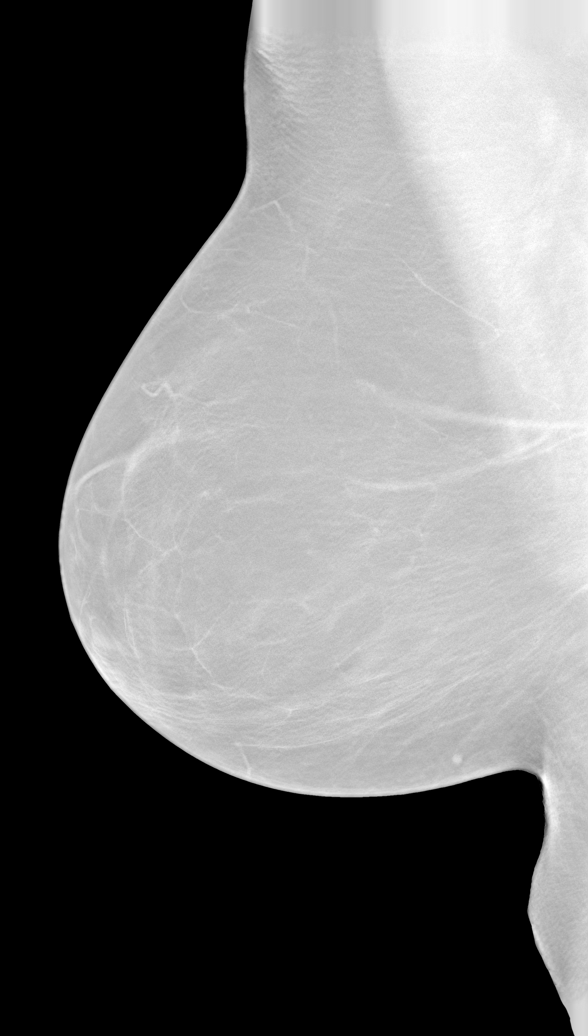
[im 2/2]
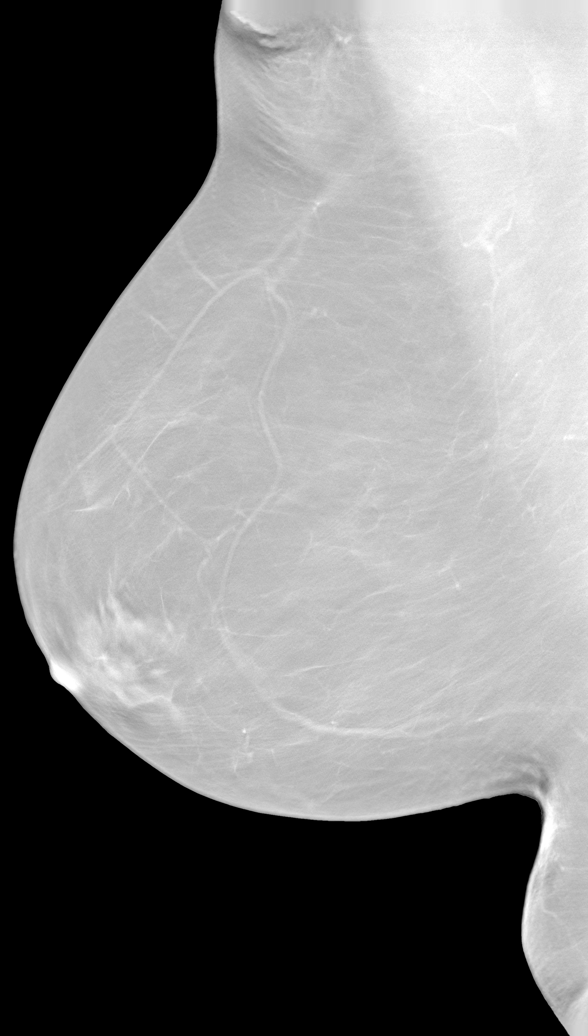

[3D SCREENING MAMMO BIL AND TOMO tomo (2 of 2) · tomo slice 12/74.0]
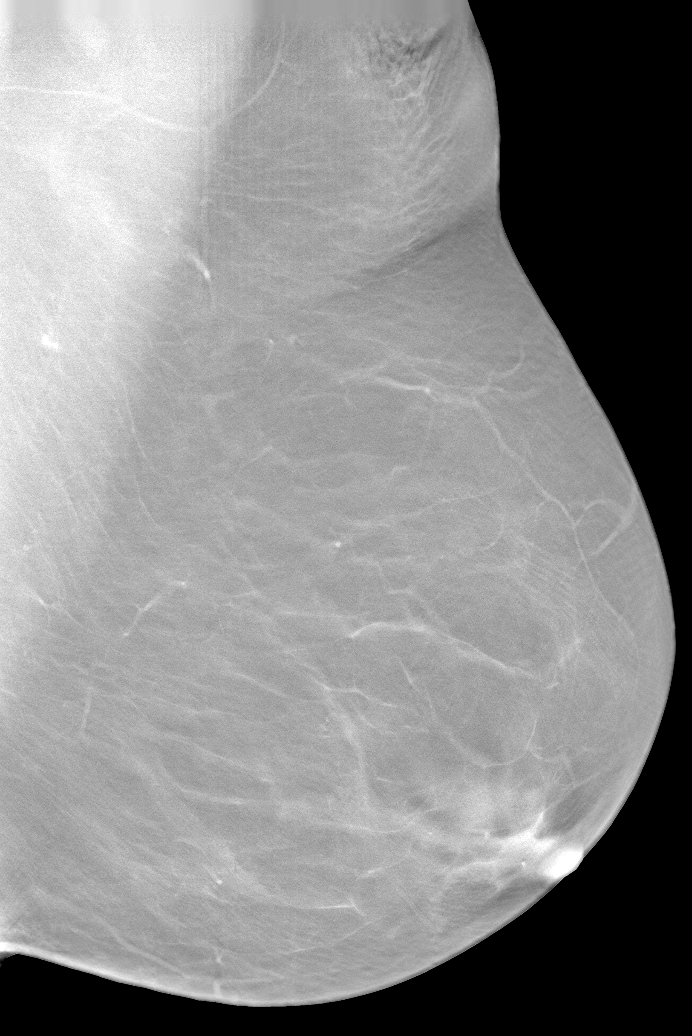

[7 of 24 positions shown; findings below may reference images not displayed]

FINDINGS: There are scattered fibroglandular elements.  There is no mass or suspicious cluster of microcalcifications.   There is no architectural distortion, skin thickening or nipple retraction.
IMPRESSION: 1.  BIRADS 2-Benign findings. Patient has been added in a reminder system with a target date for the next screening mammography.

2.  DENSITY CODE – B (Scattered areas of fibroglandular density). 

Final Assessment Code:

Bi-Rads 2 

BI-RADS 0
 Need additional imaging evaluation.

BI-RADS 1
 Negative mammogram.

BI-RADS 2
 Benign finding.

BI-RADS 3
 Probably benign finding; short-interval follow-up suggested.

BI-RADS 4
 Suspicious abnormality; biopsy should be considered.

BI-RADS 5
 Highly suggestive of malignancy; appropriate action should be taken.

BI-RADS 6
 Known biopsy-proven malignancy; appropriate action should be taken.

NOTE:
In compliance with Federal regulations, the results of this mammogram are being sent to the patient.

------------- REPORT [id number] -------------
[facility]
[address]
Tiger Ms.TARIN, DASEAN:
We wish to report the following on your recent mammography examination. We are sending a report to your referring physician or other health care provider. 
(       Normal/Negative:
No evidence of cancer.
This statement is mandated by the [place], Department of Health.
Your examination was performed by one of our technologists, who are registered radiological technologists and also specially certified in mammography:
___
Markland, Marjuan (M)

Your mammogram was interpreted by our radiologist.

( 
Collette Sedman, M.D.

(Annual Breast Examination by a physician or other health care provider
(Annual Mammography Screening beginning at age 40
(Monthly Breast Self Examination

## 2021-06-30 ENCOUNTER — Encounter (INDEPENDENT_AMBULATORY_CARE_PROVIDER_SITE_OTHER): Payer: Self-pay | Admitting: Internal Medicine

## 2021-06-30 ENCOUNTER — Other Ambulatory Visit: Payer: Self-pay

## 2021-06-30 ENCOUNTER — Ambulatory Visit: Payer: Medicare Other | Attending: Internal Medicine | Admitting: Internal Medicine

## 2021-06-30 VITALS — BP 120/82 | HR 84 | Temp 98.2°F | Ht 63.0 in

## 2021-06-30 DIAGNOSIS — E559 Vitamin D deficiency, unspecified: Secondary | ICD-10-CM | POA: Insufficient documentation

## 2021-06-30 DIAGNOSIS — F32A Depression, unspecified: Secondary | ICD-10-CM

## 2021-06-30 DIAGNOSIS — L989 Disorder of the skin and subcutaneous tissue, unspecified: Secondary | ICD-10-CM | POA: Insufficient documentation

## 2021-06-30 DIAGNOSIS — M25562 Pain in left knee: Secondary | ICD-10-CM | POA: Insufficient documentation

## 2021-06-30 DIAGNOSIS — I480 Paroxysmal atrial fibrillation: Secondary | ICD-10-CM | POA: Insufficient documentation

## 2021-06-30 DIAGNOSIS — Z0000 Encounter for general adult medical examination without abnormal findings: Secondary | ICD-10-CM | POA: Insufficient documentation

## 2021-06-30 DIAGNOSIS — F1020 Alcohol dependence, uncomplicated: Secondary | ICD-10-CM | POA: Insufficient documentation

## 2021-06-30 DIAGNOSIS — R739 Hyperglycemia, unspecified: Secondary | ICD-10-CM | POA: Insufficient documentation

## 2021-06-30 DIAGNOSIS — E538 Deficiency of other specified B group vitamins: Secondary | ICD-10-CM

## 2021-06-30 DIAGNOSIS — M5020 Other cervical disc displacement, unspecified cervical region: Secondary | ICD-10-CM | POA: Insufficient documentation

## 2021-06-30 DIAGNOSIS — G2581 Restless legs syndrome: Secondary | ICD-10-CM | POA: Insufficient documentation

## 2021-06-30 DIAGNOSIS — I10 Essential (primary) hypertension: Secondary | ICD-10-CM | POA: Insufficient documentation

## 2021-06-30 DIAGNOSIS — N959 Unspecified menopausal and perimenopausal disorder: Secondary | ICD-10-CM | POA: Insufficient documentation

## 2021-06-30 DIAGNOSIS — I8390 Asymptomatic varicose veins of unspecified lower extremity: Secondary | ICD-10-CM

## 2021-06-30 DIAGNOSIS — F411 Generalized anxiety disorder: Secondary | ICD-10-CM | POA: Insufficient documentation

## 2021-06-30 MED ORDER — AMLODIPINE 5 MG-BENAZEPRIL 40 MG CAPSULE
1.0000 | ORAL_CAPSULE | Freq: Every day | ORAL | 3 refills | Status: DC
Start: 2021-06-30 — End: 2021-09-16

## 2021-06-30 NOTE — Patient Instructions (Signed)
Medicare Preventive Services  Medicare coverage information Recommendation for YOU   Heart Disease and Diabetes   Lipid profile Every 5 years or more often if at risk for cardiovascular disease     Diabetes Screening  yearly for those at risk for diabetes, 2 tests per year for those with prediabetes Last Glucose:      Diabetes Self Management Training or Medical Nutrition Therapy  For those with diabetes, up to 10 hrs initial training within a year, subsequent years up to 2 hrs of follow up training Optional for those with diabetes     Medical Nutrition Therapy Three hours of one-on-one counseling in first year, two hours in subsequent years Optional for those with diabetes, kidney disease   Intensive Behavioral Therapy for Obesity  Face-to-face counseling, first month every week, month 2-6 every other week, month 7-12 every month if continued progress is documented Optional for those with Body Mass Index 30 or higher  Your There is no height or weight on file to calculate BMI.   Tobacco Cessation (Quitting) Counseling   Two attempts per year, max 4 sessions per attempt, up to 8 per year, for those with tobacco-related health condition Optional for those that use tobacco   Cancer Screening   Colorectal screening   For anyone age 27 to 37 or any age if high risk:  . Screening Colonoscopy every 10 yrs if low risk,  more frequent if higher risk  OR  . Cologuard Stool DNA test once every 3 years OR  . Fecal Occult Blood Testing yearly OR  . Flexible  Sigmoidoscopy  every 5 yr OR  . CT Colonography every 5 yrs    See your schedule below   Screening Pap Test Recommended every 3 years for all women age 42 to 68, or every five years if combined with HPV test (routine screening not needed after total hysterectomy).  Medicare covers every 2 years, up to yearly if high risk.  Screening Pelvic Exam Medicare covers every 2 years, yearly if high risk or childbearing age with abnormal Pap in last 3 yrs. See your schedule below    Screening Mammogram   Recommended every 2 years for women age 67 to 13, or more frequent if you have a higher risk. Selectively recommended for women between 40-49 based on shared decisions about risk. Covered by Medicare up to every year for women age 6 or older See your schedule below   Lung Cancer Screening  Annual low dose computed tomography (LDCT scan) is recommended for those age 48-77 who smoked 20 pack-years and are current smokers or quit smoking within past 15 years (one pack-year= smoking one PPD for one year), after counseling by your doctor or nurse clinician about the possible benefits or harms. See your schedule below   Vaccinations   Pneumococcal Vaccine: Recommended routinely age 82+ with one or two separate vaccines based on your risk    Recommended before age 5 if medical conditions with increased risk  Seasonal Influenza Vaccine: Once every flu season   Hepatitis B Vaccine: 3 doses if risk (including anyone with diabetes or liver disease)  Shingles Vaccine: Two doses at age 72 or older  Diphtheria Tetanus Pertussis Vaccine: ONCE as adult, booster every 10 years     There is no immunization history on file for this patient.  Shingles vaccine and Diphtheria Tetanus Pertussis vaccines are available at pharmacies or local health department without a prescription.   Other Screening   Bone Densitometry  Every 24 months for anyone at risk, including postmenopausal       Glaucoma Screening   Yearly if in high risk group such as diabetes, family history, African American age 62+ or Hispanic American age 43+      Hepatitis C Screening recommended ONCE for those born between 1945-1965, or high risk for HCV infection       HIV Testing recommended routinely at least ONCE, covered every year for age 35 to 62 regardless of risk, and every year for age over 15 who ask for the test or higher risk  Yearly or up to 3 times in pregnancy         Abdominal Aortic Aneurysm Screening Ultrasound   Once between the  age of 61-75 with a family history of AAA       Your Personalized Schedule for Preventive Tests   Health Maintenance: Pending and Last Completed       Date Due Completion Date    Osteoporosis screening Never done ---    Hepatitis C screening Never done ---    Covid-19 Vaccine (1) Never done ---    Adult Tdap-Td (1 - Tdap) Never done ---    Colonoscopy Never done ---    Mammography Never done ---    Shingles Vaccine (1 of 2) Never done ---    Pneumococcal Vaccination, Age 21+ (1 - PCV) Never done ---    Influenza Vaccine (Season Ended) 09/18/2021 ---    Depression Screening 07/01/2022 06/30/2021

## 2021-06-30 NOTE — Progress Notes (Signed)
INTERNAL MEDICINE, BUILDING A  [address]  [place] [id number]  Operated by [facility]  Medicare Annual Wellness Visit    Name: Vanessa Tanner MRN:  [id number]   Date: 06/30/2021 Age: 66 y.o.       SUBJECTIVE:   Vanessa Tanner is a 66 y.o. female for presenting for Medicare Wellness exam.   I have reviewed and reconciled the medication list with the patient today.        06/30/2021     2:00 PM   Comprehensive Health Assessment-Adult   Do you wish to complete this form? Yes   During the past 4 weeks, how would you rate your health in general? Very Good   During the past 4 weeks, how much difficulty have you had doing your usual activities inside and outside your home because of medical or emotional problems? No difficulty at all   During the past 4 weeks, was someone available to help you if you needed and wanted help? Yes, as much as I wanted   In the past year, how many times have you gone to the emergency department or been admitted to a hospital for a health problem? None   Are you generally satisfied with your sleep? Yes   Do you have enough money to buy things you need in everyday life, such as food, clothing, medicines, and housing? Yes, always   Can you get to places beyond walking distance without help?  (For example, can you drive your own car or travel alone on buses)? Yes   Do you fasten your seatbelt when you are in a car? Yes, usually   Do you exercise 20 minutes 3 or more days per week (such as walking, dancing, biking, mowing grass, swimming)? No, I usually don't exercise this much   How often do you eat food that is healthy (fruits, vegetables, lean meats) instead of unhealthy (sweets, fast food, junk food, fatty foods)? Almost always   Have your parents, brothers or sisters had any of the following problems before the age of 67? (check all that apply) Heart problems, or hardening of the arteries;Diabetes (sugar);Cancer   Do you need any help communicating with your doctors  and nurses because of vision or hearing problems? No   During the past 12 months, have you experienced confusion or memory loss that is happening more often or is getting worse? No   Do you have one person you think of as your personal doctor (primary care provider or family doctor)? Yes   If you are seeing a Primary Care Provider (PCP) or family doctor. please list their name Hollace Michelli   Are you now also seeing any specialist physician(s) (such as eye doctor, foot doctor, skin doctor)? No   How confident are you that you can control or manage most of your health problems? Very confident         I have reviewed and updated as appropriate the past medical, family and social history. 06/30/2021 as summarized below:  Past Medical History:   Diagnosis Date   . Alcohol dependence syndrome (CMS HCC)    . B12 deficiency    . Depressive disorder    . Displacement of cervical intervertebral disc without myelopathy    . Essential hypertension    . Facial lesion    . GAD (generalized anxiety disorder)    . Hyperglycemia    . Hypomagnesemia    . Knee pain, bilateral    . Menopausal and  postmenopausal disorder    . Paroxysmal A-fib (CMS HCC)    . RLS (restless legs syndrome)    . Varicose veins of lower extremity     vein injection    Dr  Ladoris Gene   . Vitamin D deficiency    . Vitamin D deficiency      History reviewed. No pertinent surgical history.  Current Outpatient Medications   Medication Sig   . Amlodipine-Benazepril 5-40 mg Oral Capsule Take 1 Capsule by mouth Once a day for 90 days   . apixaban (ELIQUIS) 5 mg Oral Tablet Take by mouth Twice daily   . calcium carbonate 600 mg calcium (1,500 mg) Oral Tablet Take 1 Tablet (600 mg total) by mouth Once a day   . Cholecalciferol, Vitamin D3, 25 mcg (1,000 unit) Oral Capsule Take 1 Capsule (1,000 Units total) by mouth Once a day   . cyanocobalamin (VITAMIN B 12) 1,000 mcg Oral Tablet Take 1 Tablet (1,000 mcg total) by mouth Once a day   . EPINEPHrine 0.3 mg/0.3 mL Injection  Auto-Injector Inject 0.3 mL (0.3 mg total) into the muscle Once, as needed   . magnesium oxide (MAG-OX) 400 mg Oral Tablet Take 1 Tablet (400 mg total) by mouth Once a day   . multivitamin with iron Oral Tablet Take 1 Tablet by mouth Once a day   . psyllium husk 3.4 gram/5.4 gram Oral Powder Take by mouth     Family Medical History:     Problem Relation (Age of Onset)    Arthritis-osteo Brother    CKD Sister    Coronary Artery Disease Brother, Brother    Diabetes Sister    Diabetes type II Sister, Brother    Drug Abuse Brother    HTN <20 y.o. Mother, Brother    Lung disease Father    Osteoporosis Mother    Parkinsons Disease Brother          Social History     Socioeconomic History   . Marital status: Single   Tobacco Use   . Smoking status: Former     Types: Cigarettes   . Smokeless tobacco: Never   Vaping Use   . Vaping Use: Never used   Substance and Sexual Activity   . Alcohol use: Yes   . Drug use: Never     Social Determinants of Health     Financial Resource Strain: Low Risk    . SDOH Financial: No   Transportation Needs: Low Risk    . SDOH Transportation: No   Social Connections: Low Risk    . SDOH Social Isolation: 5 or more times a week   Intimate Partner Violence: Low Risk    . SDOH Domestic Violence: No   Housing Stability: Low Risk    . SDOH Housing Situation: I have housing.   Marland Kitchen SDOH Housing Worry: No        List of Current Health Care Providers   Care Team     PCP     Name Type Specialty Phone Number    Samuella Cota, [place] Physician Assistant PHYSICIAN ASSISTANT [phone]          Care Team     No care team found                  Health Maintenance   Topic Date Due   . Osteoporosis screening  Never done   . Hepatitis C screening  Never done   . Covid-19 Vaccine (1) Never  done   . Adult Tdap-Td (1 - Tdap) Never done   . Colonoscopy  Never done   . Mammography  Never done   . Shingles Vaccine (1 of 2) Never done   . Pneumococcal Vaccination, Age 84+ (1 - PCV) Never done   . Annual Wellness Visit   Never done   . Influenza Vaccine (Season Ended) 09/18/2021   . Meningococcal Vaccine  Aged Out   . Depression Screening  Discontinued     Medicare Wellness Assessment   Medicare initial or wellness physical in the last year?: Yes  Advance Directives (optional)   Does patient have a living will or MPOA: no   Has patient provided [employer] with a copy?: no   Advance directive information given to the patient today?: no      Activities of Daily Living   Do you need help with dressing, bathing, or walking?: No   Do you need help with shopping, housekeeping, medications, or finances?: No   Do you have rugs in hallways, broken steps, or poor lighting?: No   Do you have grab bars in your bathroom, non-slip strips in your tub, and hand rails on your stairs?: No   Urinary Incontinence Screen (Women >=65 only)   Do you ever leak urine when you don't want to?: No   Cognitive Function Screen (1=Yes, 0=No)   What is you age?: Correct   What is the time to the nearest hour?: Correct   What is the year?: Correct   What is the name of this clinic?: Correct   Can the patient recognize two persons (the doctor, the nurse, home help, etc.)?: Correct   What is the date of your birth? (day and month sufficient) : Correct   In what year did World War II end?: Incorrect   Who is the current president of the [place]?: Correct   Count from 20 down to 1?: Correct   What address did I give you earlier?: Correct   Total Score: 9       Hearing Screen   Have you noticed any hearing difficulties?: No  After whispering 9-1-6 how many numbers did the patient repeat correctly?: 3   Fall Risk Screen   Do you feel unsteady when standing or walking?: No  Do you worry about falling?: No  Have you fallen in the past year?: No   Vision Screen           Depression Screen   Little interest or pleasure in doing things.: Not at all  Feeling down, depressed, or hopeless: Not at all  PHQ 2 Total: 0   Pain Score        Substance Use-Abuse Screening    Tobacco Use   In Past 12 MONTHS, how often have you used any tobacco product (for example, cigarettes, e-cigarettes, cigars, pipes, or smokeless tobacco)?: Never   Alcohol use       Prescription Drug Use             Illicit Drug Use               OBJECTIVE:   BP 120/82 (Site: Left, Patient Position: Sitting)   Pulse 84   Temp 36.8 C (98.2 F)   Ht 1.6 m (5\' 3" )   SpO2 96%      Exam-AMB  Const  General: cooperative, healthy appearing and no acute distress  Orientation/Consciousness: patient oriented x3  HENMT  Ears: hearing grossly normal bilaterally  Eyes  General: appearance normal, both eyes and all related structures  Conjunctivae: conjunctivae normal  Sclera: sclerae normal  EOM: EOM intact bilaterally  Neck  Neck: normal visual inspection and no lymphadenopathy  Thyroid: thyroid normal  Carotids: no bruits  Resp  Effort & Inspection: normal respiratory effort  Auscultation: clear to auscultation bilaterally, no crackles, no rales, no rhonchi and no wheezes  Cardio  Jugular venous pressure: no JVD  Rate: regular rate  Rhythm: regular rhythm  Heart Sounds: S1 normal, S2 normal, no click, no murmurs and no rubs  Bruits: no carotid bruits  GI  Inspection: Yes normal to inspection  Palpation: soft, no hepatosplenomegaly, no guarding, no masses and nontender  Auscultation: normal bowel sounds  Neuro  General: patient oriented x3  Extrem  General: normal to inspection, normal exam except as noted, clubbing, cyanosis or edema noted, normal gait and other  Psych  Mental Status: mental status grossly normal  Mood: congruent mood  Affect: normal affect  Insight: insight good  Judgment: judgment good    Declined gyn exam        Health Maintenance Due   Topic Date Due   . Osteoporosis screening  Never done   . Hepatitis C screening  Never done   . Covid-19 Vaccine (1) Never done   . Adult Tdap-Td (1 - Tdap) Never done   . Colonoscopy  Never done   . Mammography  Never done   . Shingles Vaccine (1 of 2) Never done   .  Pneumococcal Vaccination, Age 57+ (1 - PCV) Never done   . Annual Wellness Visit  Never done      ASSESSMENT & PLAN:   Assessment/Plan   1. Encounter for Medicare annual wellness exam       Identified Risk Factors/ Recommended Actions       The PHQ 2 Total: 0 depression screen is interpreted as negative.          Orders Placed This Encounter   . Amlodipine-Benazepril 5-40 mg Oral Capsule          The patient has been educated about risk factors and recommended preventive care. Written Prevention Plan completed/ updated and given to patient (see After Visit Summary).    Eye exam 2023 Jan overall stable    Seen by Cardiology  Dr Renda Rolls   q 2 mo and stable  But was watching  BP    Fu with Derm  Acne rosacea  Ortho give injections in  bil knees since last visit  Tammy Sours Southerns  / Trinda Pascal   No new changes  Pt doing well   Meds reviewed as well as labs.  Chart reviewed and updated.   Continue current treatment.  Keep follow-up appointment.   Vaccine hx reviewed.             Samuella Cota, PA-C

## 2021-06-30 NOTE — Nursing Note (Signed)
06/30/21 1400   Medicare Wellness Assessment   Medicare initial or wellness physical in the last year? Yes   Advance Directives   Does patient have a living will or MPOA no   Has patient provided [employer] with a copy? no   Advance directive information given to the patient today? no   Activities of Daily Living   Do you need help with dressing, bathing, or walking? No   Do you need help with shopping, housekeeping, medications, or finances? No   Do you have rugs in hallways, broken steps, or poor lighting? No   Do you have grab bars in your bathroom, non-slip strips in your tub, and hand rails on your stairs? No   Urinary Incontinence Screen   Do you ever leak urine when you don't want to? No   Cognitive Function Screen   What is you age? 1   What is the time to the nearest hour? 1   What is the year? 1   What is the name of this clinic? 1   Can the patient recognize two persons (the doctor, the nurse, home help, etc.)? 1   What is the date of your birth? (day and month sufficient)  1   In what year did World War II end? 0   Who is the current president of the [place]? 1   Count from 20 down to 1? 1   What address did I give you earlier? 1   Total Score 9   Depression Screen   Little interest or pleasure in doing things. 0   Feeling down, depressed, or hopeless 0   PHQ 2 Total 0   Substance Use Screening   In Past 12 MONTHS, how often have you used any tobacco product (for example, cigarettes, e-cigarettes, cigars, pipes, or smokeless tobacco)? Never   Hearing Screen   Have you noticed any hearing difficulties? No   After whispering 9-1-6 how many numbers did the patient repeat correctly? 3   Fall Risk Assessment   Do you feel unsteady when standing or walking? No   Do you worry about falling? No   Have you fallen in the past year? No

## 2021-08-03 ENCOUNTER — Other Ambulatory Visit (INDEPENDENT_AMBULATORY_CARE_PROVIDER_SITE_OTHER): Payer: Self-pay

## 2021-09-15 ENCOUNTER — Other Ambulatory Visit (RURAL_HEALTH_CENTER): Payer: Self-pay | Admitting: Internal Medicine

## 2021-11-03 ENCOUNTER — Encounter (INDEPENDENT_AMBULATORY_CARE_PROVIDER_SITE_OTHER): Payer: Self-pay | Admitting: Internal Medicine

## 2021-11-03 ENCOUNTER — Other Ambulatory Visit: Payer: Self-pay

## 2021-11-03 ENCOUNTER — Ambulatory Visit: Payer: Medicare Other | Attending: Internal Medicine | Admitting: Internal Medicine

## 2021-11-03 VITALS — BP 160/100 | HR 86 | Ht 63.0 in | Wt 183.0 lb

## 2021-11-03 DIAGNOSIS — I10 Essential (primary) hypertension: Secondary | ICD-10-CM | POA: Insufficient documentation

## 2021-11-03 DIAGNOSIS — I480 Paroxysmal atrial fibrillation: Secondary | ICD-10-CM | POA: Insufficient documentation

## 2021-11-03 DIAGNOSIS — R739 Hyperglycemia, unspecified: Secondary | ICD-10-CM | POA: Insufficient documentation

## 2021-11-03 DIAGNOSIS — E559 Vitamin D deficiency, unspecified: Secondary | ICD-10-CM | POA: Insufficient documentation

## 2021-11-03 DIAGNOSIS — E538 Deficiency of other specified B group vitamins: Secondary | ICD-10-CM | POA: Insufficient documentation

## 2021-11-03 DIAGNOSIS — R002 Palpitations: Secondary | ICD-10-CM | POA: Insufficient documentation

## 2021-11-03 LAB — COMPREHENSIVE METABOLIC PNL, FASTING
ALBUMIN/GLOBULIN RATIO: 1.8 — ABNORMAL HIGH (ref 0.8–1.4)
ALBUMIN: 4.4 g/dL (ref 3.5–5.7)
ALKALINE PHOSPHATASE: 59 U/L (ref 34–104)
ALT (SGPT): 15 U/L (ref 7–52)
ANION GAP: 8 mmol/L (ref 4–13)
AST (SGOT): 20 U/L (ref 13–39)
BILIRUBIN TOTAL: 0.5 mg/dL (ref 0.3–1.2)
BUN/CREA RATIO: 27 — ABNORMAL HIGH (ref 6–22)
BUN: 18 mg/dL (ref 7–25)
CALCIUM, CORRECTED: 9.4 mg/dL (ref 8.9–10.8)
CALCIUM: 9.8 mg/dL (ref 8.6–10.3)
CHLORIDE: 106 mmol/L (ref 98–107)
CO2 TOTAL: 25 mmol/L (ref 21–31)
CREATININE: 0.67 mg/dL (ref 0.60–1.30)
ESTIMATED GFR: 96 mL/min/{1.73_m2} (ref >59)
GLOBULIN: 2.4 — ABNORMAL LOW (ref 2.9–5.4)
GLUCOSE: 99 mg/dL (ref 74–109)
OSMOLALITY, CALCULATED: 280 mOsm/kg (ref 270–290)
POTASSIUM: 4.2 mmol/L (ref 3.5–5.1)
PROTEIN TOTAL: 6.8 g/dL (ref 6.4–8.9)
SODIUM: 139 mmol/L (ref 136–145)

## 2021-11-03 LAB — CBC WITH DIFF
BASOPHIL #: 0.1 10*3/uL (ref 0.00–0.10)
BASOPHIL %: 1 % (ref 0–1)
EOSINOPHIL #: 0.1 10*3/uL (ref 0.00–0.50)
EOSINOPHIL %: 2 %
HCT: 39.4 % (ref 31.2–41.9)
HGB: 13.7 g/dL (ref 10.9–14.3)
LYMPHOCYTE #: 1.2 10*3/uL (ref 1.00–3.00)
LYMPHOCYTE %: 25 % (ref 16–44)
MCH: 34.5 pg — ABNORMAL HIGH (ref 24.7–32.8)
MCHC: 34.8 g/dL (ref 32.3–35.6)
MCV: 98.9 fL — ABNORMAL HIGH (ref 75.5–95.3)
MONOCYTE #: 0.5 10*3/uL (ref 0.30–1.00)
MONOCYTE %: 11 % (ref 5–13)
MPV: 8.9 fL (ref 7.9–10.8)
NEUTROPHIL #: 3 10*3/uL (ref 1.85–7.80)
NEUTROPHIL %: 61 % (ref 43–77)
PLATELETS: 159 10*3/uL (ref 140–440)
RBC: 3.98 10*6/uL (ref 3.63–4.92)
RDW: 12.9 % (ref 12.3–17.7)
WBC: 4.9 10*3/uL (ref 3.8–11.8)

## 2021-11-03 LAB — HGA1C (HEMOGLOBIN A1C WITH EST AVG GLUCOSE): HEMOGLOBIN A1C: 5.1 % (ref 4.0–6.0)

## 2021-11-03 LAB — VITAMIN D 25 TOTAL: VITAMIN D: 60 ng/mL (ref 30–100)

## 2021-11-03 LAB — LIPID PANEL
CHOL/HDL RATIO: 2
CHOLESTEROL: 203 mg/dL — ABNORMAL HIGH (ref <200)
HDL CHOL: 102 mg/dL — ABNORMAL HIGH (ref 23–92)
LDL CALC: 90 mg/dL (ref 0–100)
TRIGLYCERIDES: 56 mg/dL (ref <=150)
VLDL CALC: 11 mg/dL (ref 0–50)

## 2021-11-03 LAB — THYROID STIMULATING HORMONE (SENSITIVE TSH): TSH: 1.065 u[IU]/mL (ref 0.450–5.330)

## 2021-11-03 LAB — MAGNESIUM: MAGNESIUM: 1.7 mg/dL — ABNORMAL LOW (ref 1.9–2.7)

## 2021-11-03 LAB — VITAMIN B12: VITAMIN B 12: 321 pg/mL (ref 180–914)

## 2021-11-03 MED ORDER — AREXVY (PF) 120 MCG/0.5 ML IM SUSPENSION
0.5000 mL | INHALATION_SUSPENSION | Freq: Once | INTRAMUSCULAR | 0 refills | Status: AC
Start: 2021-11-03 — End: 2021-11-03

## 2021-11-03 MED ORDER — SHINGRIX (PF) 50 MCG/0.5 ML INTRAMUSCULAR SUSPENSION, KIT
0.5000 mL | INHALATION_SUSPENSION | Freq: Once | INTRAMUSCULAR | 1 refills | Status: AC
Start: 2021-11-03 — End: 2021-11-03

## 2022-03-09 ENCOUNTER — Encounter (INDEPENDENT_AMBULATORY_CARE_PROVIDER_SITE_OTHER): Payer: Self-pay | Admitting: Internal Medicine

## 2022-03-09 ENCOUNTER — Other Ambulatory Visit: Payer: Self-pay

## 2022-03-09 ENCOUNTER — Telehealth (INDEPENDENT_AMBULATORY_CARE_PROVIDER_SITE_OTHER): Payer: Self-pay | Admitting: Internal Medicine

## 2022-03-09 ENCOUNTER — Ambulatory Visit: Payer: Medicare Other | Attending: Internal Medicine | Admitting: Internal Medicine

## 2022-03-09 VITALS — BP 128/80 | HR 78 | Temp 99.0°F | Ht 63.0 in | Wt 179.0 lb

## 2022-03-09 DIAGNOSIS — N390 Urinary tract infection, site not specified: Secondary | ICD-10-CM | POA: Insufficient documentation

## 2022-03-09 DIAGNOSIS — M25562 Pain in left knee: Secondary | ICD-10-CM | POA: Insufficient documentation

## 2022-03-09 DIAGNOSIS — M170 Bilateral primary osteoarthritis of knee: Secondary | ICD-10-CM

## 2022-03-09 DIAGNOSIS — I480 Paroxysmal atrial fibrillation: Secondary | ICD-10-CM | POA: Insufficient documentation

## 2022-03-09 DIAGNOSIS — R739 Hyperglycemia, unspecified: Secondary | ICD-10-CM

## 2022-03-09 DIAGNOSIS — F325 Major depressive disorder, single episode, in full remission: Secondary | ICD-10-CM | POA: Insufficient documentation

## 2022-03-09 DIAGNOSIS — G2581 Restless legs syndrome: Secondary | ICD-10-CM

## 2022-03-09 DIAGNOSIS — E559 Vitamin D deficiency, unspecified: Secondary | ICD-10-CM

## 2022-03-09 DIAGNOSIS — M25561 Pain in right knee: Secondary | ICD-10-CM | POA: Insufficient documentation

## 2022-03-09 DIAGNOSIS — I10 Essential (primary) hypertension: Secondary | ICD-10-CM | POA: Insufficient documentation

## 2022-03-09 LAB — LIPID PANEL
CHOL/HDL RATIO: 2.2
CHOLESTEROL: 197 mg/dL (ref <200)
HDL CHOL: 91 mg/dL (ref 23–92)
LDL CALC: 93 mg/dL (ref 0–100)
TRIGLYCERIDES: 65 mg/dL (ref <=150)
VLDL CALC: 13 mg/dL (ref 0–50)

## 2022-03-09 LAB — POCT URINE DIPSTICK
BILIRUBIN: NEGATIVE
BLOOD: NEGATIVE
GLUCOSE: NEGATIVE
KETONE: NEGATIVE
NITRITE: NEGATIVE
PH: 6.5
PROTEIN: NEGATIVE
SPECIFIC GRAVITY: 1.03
UROBILINOGEN: 1

## 2022-03-09 LAB — CBC WITH DIFF
BASOPHIL #: 0 10*3/uL (ref 0.00–0.10)
BASOPHIL %: 1 % (ref 0–1)
EOSINOPHIL #: 0.1 10*3/uL (ref 0.00–0.50)
EOSINOPHIL %: 1 %
HCT: 41 % (ref 31.2–41.9)
HGB: 14 g/dL (ref 10.9–14.3)
LYMPHOCYTE #: 2.7 10*3/uL (ref 1.00–3.00)
LYMPHOCYTE %: 54 % — ABNORMAL HIGH (ref 16–44)
MCH: 33 pg — ABNORMAL HIGH (ref 24.7–32.8)
MCHC: 34.1 g/dL (ref 32.3–35.6)
MCV: 96.7 fL — ABNORMAL HIGH (ref 75.5–95.3)
MONOCYTE #: 0.5 10*3/uL (ref 0.30–1.00)
MONOCYTE %: 10 % (ref 5–13)
MPV: 8.2 fL (ref 7.9–10.8)
NEUTROPHIL #: 1.7 10*3/uL — ABNORMAL LOW (ref 1.85–7.80)
NEUTROPHIL %: 34 % — ABNORMAL LOW (ref 43–77)
PLATELETS: 119 10*3/uL — ABNORMAL LOW (ref 140–440)
RBC: 4.24 10*6/uL (ref 3.63–4.92)
RDW: 12.5 % (ref 12.3–17.7)
WBC: 5.1 10*3/uL (ref 3.8–11.8)

## 2022-03-09 LAB — COMPREHENSIVE METABOLIC PNL, FASTING
ALBUMIN/GLOBULIN RATIO: 1.7 — ABNORMAL HIGH (ref 0.8–1.4)
ALBUMIN: 4.5 g/dL (ref 3.5–5.7)
ALKALINE PHOSPHATASE: 69 U/L (ref 34–104)
ALT (SGPT): 23 U/L (ref 7–52)
ANION GAP: 7 mmol/L (ref 4–13)
AST (SGOT): 29 U/L (ref 13–39)
BILIRUBIN TOTAL: 0.4 mg/dL (ref 0.3–1.2)
BUN/CREA RATIO: 25 — ABNORMAL HIGH (ref 6–22)
BUN: 16 mg/dL (ref 7–25)
CALCIUM, CORRECTED: 9.8 mg/dL (ref 8.9–10.8)
CALCIUM: 10.2 mg/dL (ref 8.6–10.3)
CHLORIDE: 104 mmol/L (ref 98–107)
CO2 TOTAL: 28 mmol/L (ref 21–31)
CREATININE: 0.65 mg/dL (ref 0.60–1.30)
ESTIMATED GFR: 96 mL/min/{1.73_m2} (ref >59)
GLOBULIN: 2.7 — ABNORMAL LOW (ref 2.9–5.4)
GLUCOSE: 101 mg/dL (ref 74–109)
OSMOLALITY, CALCULATED: 279 mOsm/kg (ref 270–290)
POTASSIUM: 4.9 mmol/L (ref 3.5–5.1)
PROTEIN TOTAL: 7.2 g/dL (ref 6.4–8.9)
SODIUM: 139 mmol/L (ref 136–145)

## 2022-03-09 LAB — THYROID STIMULATING HORMONE (SENSITIVE TSH): TSH: 1.015 u[IU]/mL (ref 0.450–5.330)

## 2022-03-09 LAB — VITAMIN D 25 TOTAL: VITAMIN D: 67 ng/mL (ref 30–100)

## 2022-03-09 LAB — MAGNESIUM: MAGNESIUM: 1.9 mg/dL (ref 1.9–2.7)

## 2022-03-09 MED ORDER — AREXVY (PF) 120 MCG/0.5 ML IM SUSPENSION
0.5000 mL | INHALATION_SUSPENSION | Freq: Once | INTRAMUSCULAR | 0 refills | Status: AC
Start: 2022-03-09 — End: 2022-03-09

## 2022-03-09 MED ORDER — SHINGRIX (PF) 50 MCG/0.5 ML INTRAMUSCULAR SUSPENSION, KIT
0.5000 mL | INHALATION_SUSPENSION | Freq: Once | INTRAMUSCULAR | 1 refills | Status: AC
Start: 2022-03-09 — End: 2022-03-09

## 2022-03-09 NOTE — Nursing Note (Signed)
4 month follow up pt would like to get a referral for her knee states that it hurts and swollen more  And has been having pain in her sides when she gets up in the morning.

## 2022-03-09 NOTE — Progress Notes (Signed)
Name: Vanessa Tanner                       Date of Birth: 1955-08-03   MRN:  [id number]                         Date of visit: 03/09/2022     PCP: Delaney Meigs, PA-C     Subjective  Vanessa Tanner is a 67 y.o. year old female who presents for Follow Up 4 Months (4 month follow up pt would like to get a referral for her knee states that it hurts and swollen more/And has been having pain in her sides when she gets up in the morning. )   to clinic.  No specialty comments available.   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[place]) 03/14/2022    B12 deficiency 06/30/2021    Depressive disorder 06/30/2021    Displacement of cervical intervertebral disc without myelopathy 06/30/2021    Essential hypertension 06/30/2021    Facial lesion 06/30/2021    GAD (generalized anxiety disorder) 06/30/2021    Hyperglycemia 06/30/2021    Hypomagnesemia 06/30/2021    Knee pain, bilateral 06/30/2021    Menopausal and postmenopausal disorder 06/30/2021    Paroxysmal A-fib (CMS HCC) 06/30/2021    RLS (restless legs syndrome) 06/30/2021    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     vein injection    Dr  Janine Limbo      Vitamin D deficiency 06/30/2021      Current Outpatient Medications   Medication Sig    Amlodipine-Benazepril 5-40 mg Oral Capsule TAKE 1 CAPSULE BY MOUTH EVERY DAY    apixaban (ELIQUIS) 5 mg Oral Tablet Take by mouth Twice daily    calcium carbonate 600 mg calcium (1,500 mg) Oral Tablet Take 1 Tablet (600 mg total) by mouth Once a day    Cholecalciferol, Vitamin D3, 25 mcg (1,000 unit) Oral Capsule Take 1 Capsule (1,000 Units total) by mouth Once a day    cyanocobalamin (VITAMIN B 12) 1,000 mcg Oral Tablet Take 1 Tablet (1,000 mcg total) by mouth Once a day    EPINEPHrine 0.3 mg/0.3 mL Injection Auto-Injector Inject 0.3 mL (0.3 mg total) into the muscle Once, as needed    magnesium oxide  (MAG-OX) 400 mg Oral Tablet Take 1 Tablet (400 mg total) by mouth Once a day    multivitamin with iron Oral Tablet Take 1 Tablet by mouth Once a day    psyllium husk 3.4 gram/5.4 gram Oral Powder Take by mouth        Chronic Disease Management-AMB  FU  VISIT   FU OA knees bilateral  pthas had injections of steroids and they help for  months but  knows her insurance covers the visc type shots and wants to  try them  current ortho does not do them in office   Pt would like  new referral     Co pain in sides  when she gets up   does not last long   ? Due to way she lays  ? 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 Jeb Levering for desensitization   Pt stopped tx    she carries  epi pen      Follow-up  Hx of depression stable off all  meds     Follow-up low B12 patient is on B12 supplement    Follow-up low magnesium patient on magnesium supplement 400 daily    Follow-up postmenopausal syndrome    Follow-up history of displacement of intervertebral disc without myelopathy no new complaints    Follow up  History of hyperglycemia labs have been stable                REVIEW OF SYSTEMS:   Review of Systems  General: No fever.  No chills.  No weight changes.  Energy  lost  x  2 days    urination down   Friday bought  azo and  took  x 2 days  and  azo  helped the flow     HEENT: No vision changes.  Cardiac: No chest pain. No palpitations.  No dizziness.  No light-headedness.  No near syncope.  Resp: No dyspnea at rest, no dyspnea on exertion; no cough or hemoptysis; no orthopnea or PND.  GI: No N/V. No melena.  No bright red blood per rectum.  Ext: No edema.  No claudication.    Neuro: No focal weakness.  No numbness.  Knee pain   constant  and  wants to try  visc shot she has had cortisone and it helps but wants to see someone that will consider  visc type injections    All other  ROS negative.      Objective:   BP 128/80 (Site: Left, Patient Position: Sitting)   Pulse 78   Temp 37.2 C (99 F)   Ht 1.6 m ('5\' 3"'$ )   Wt 81.2 kg (179 lb)   SpO2 97%   BMI 31.71 kg/m              PHYSICAL EXAM  Physical Exam  Gen: NAD. Alert.   HEENT: PERRL; conjunctivae clear. No JVD or carotid bruit.  Cardiac: RRR with normal S1, S2.   Lungs: Clear to auscultation bilaterally. No rales. No wheezing. No rhonchi.  Abdomen: Soft, non-tender.non-distended  nl bowel sounds  np pain palpated   Extremities: No edema. No cyanosis. No clubbing.  Swelling  at knees  and some crepitus    Neurologic:  Grossly intact    Lab Results   Component Value Date    CHOLESTEROL 197 03/09/2022    HDLCHOL 91 03/09/2022    LDLCHOL 93 03/09/2022    TRIG 65 03/09/2022       COMPLETE BLOOD COUNT   Lab Results   Component Value Date    WBC 5.1 03/09/2022    HGB 14.0 03/09/2022    HCT 41.0 03/09/2022    PLTCNT 119 (L) 03/09/2022       DIFFERENTIAL  Lab Results   Component Value Date    PMNS 34 (L) 03/09/2022    LYMPHOCYTES 54 (H) 03/09/2022    MONOCYTES 10 03/09/2022    EOSINOPHIL 1 03/09/2022    BASOPHILS 1 03/09/2022    BASOPHILS 0.00 03/09/2022    PMNABS 1.70 (L) 03/09/2022    LYMPHSABS 2.70 03/09/2022    EOSABS 0.10 03/09/2022    MONOSABS 0.50 03/09/2022        COMPREHENSIVE METABOLIC PANEL   Lab Results   Component Value Date    SODIUM 139 03/09/2022  POTASSIUM 4.9 03/09/2022    CHLORIDE 104 03/09/2022    CO2 28 03/09/2022    ANIONGAP 7 03/09/2022    BUN 16 03/09/2022    CREATININE 0.65 03/09/2022    GLUCOSENF 101 03/09/2022    CALCIUM 10.2 03/09/2022    ALBUMIN 4.5 03/09/2022    TOTALPROTEIN 7.2 03/09/2022    ALKPHOS 69 03/09/2022    AST 29 03/09/2022    ALT 23 03/09/2022            THYROID STIMULATING HORMONE  Lab Results   Component Value Date    TSH 1.015 03/09/2022        Lab Results   Component Value Date    HA1C 5.3 03/09/2022     Lab Results   Component Value Date    VITD 67 03/09/2022         Assessment/Plan  Problem  List Items Addressed This Visit          Cardiovascular System    Essential hypertension - Primary    Relevant Orders    MAMMO BILATERAL SCREENING-ADDL VIEWS/BREAST US AS REQ BY RAD    CBC/DIFF (Completed)    COMPREHENSIVE METABOLIC PNL, FASTING (Completed)    THYROID STIMULATING HORMONE (SENSITIVE TSH) (Completed)    LIPID PANEL (Completed)    HGA1C (HEMOGLOBIN A1C WITH EST AVG GLUCOSE) (Completed)    VITAMIN D 25 TOTAL (Completed)    Paroxysmal A-fib (CMS HCC)    Relevant Orders    MAMMO BILATERAL SCREENING-ADDL VIEWS/BREAST US AS REQ BY RAD    CBC/DIFF (Completed)    COMPREHENSIVE METABOLIC PNL, FASTING (Completed)    THYROID STIMULATING HORMONE (SENSITIVE TSH) (Completed)    LIPID PANEL (Completed)    HGA1C (HEMOGLOBIN A1C WITH EST AVG GLUCOSE) (Completed)    VITAMIN D 25 TOTAL (Completed)       Neurologic    RLS (restless legs syndrome)    Relevant Orders    MAMMO BILATERAL SCREENING-ADDL VIEWS/BREAST US AS REQ BY RAD    CBC/DIFF (Completed)    COMPREHENSIVE METABOLIC PNL, FASTING (Completed)    THYROID STIMULATING HORMONE (SENSITIVE TSH) (Completed)    LIPID PANEL (Completed)    HGA1C (HEMOGLOBIN A1C WITH EST AVG GLUCOSE) (Completed)    VITAMIN D 25 TOTAL (Completed)       Endocrine    Hyperglycemia    Relevant Orders    MAMMO BILATERAL SCREENING-ADDL VIEWS/BREAST US AS REQ BY RAD    CBC/DIFF (Completed)    COMPREHENSIVE METABOLIC PNL, FASTING (Completed)    THYROID STIMULATING HORMONE (SENSITIVE TSH) (Completed)    LIPID PANEL (Completed)    HGA1C (HEMOGLOBIN A1C WITH EST AVG GLUCOSE) (Completed)    VITAMIN D 25 TOTAL (Completed)    Vitamin D deficiency    Relevant Orders    VITAMIN D 25 TOTAL (Completed)       Psychiatric    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)       Other    Hypomagnesemia    Relevant Orders    MAMMO BILATERAL SCREENING-ADDL VIEWS/BREAST US AS REQ BY RAD    CBC/DIFF (Completed)    COMPREHENSIVE METABOLIC PNL, FASTING (Completed)    THYROID STIMULATING HORMONE (SENSITIVE TSH) (Completed)     LIPID PANEL (Completed)    HGA1C (HEMOGLOBIN A1C WITH EST AVG GLUCOSE) (Completed)    VITAMIN D 25 TOTAL (Completed)    MAGNESIUM (Completed)     Other Visit Diagnoses       UTI (urinary tract infection)  Relevant Orders    POCT Urine Dipstick (Completed)    URINE CULTURE (Completed)             Orders Placed This Encounter    URINE CULTURE    MAMMO BILATERAL SCREENING-ADDL VIEWS/BREAST US AS REQ BY RAD    CBC/DIFF    COMPREHENSIVE METABOLIC PNL, FASTING    THYROID STIMULATING HORMONE (SENSITIVE TSH)    LIPID PANEL    HGA1C (HEMOGLOBIN A1C WITH EST AVG GLUCOSE)    VITAMIN D 25 TOTAL    MAGNESIUM    CBC WITH DIFF    POCT Urine Dipstick    RSVPreF3 antigen-AS01E, PF, (AREXVY, PF,) 120 mcg/0.5 mL IntraMUSCULAR Suspension for Reconstitution    varicella-zoster, PF, (SHINGRIX, PF,) 50 mcg/0.5 mL IntraMUSCULAR Suspension for Reconstitution        Meds reviewed as well as labs.  Chart reviewed and updated.   Continue current treatment.  Keep follow-up appointment.   Vaccine hx reviewed.   Labs in  fu   pt is nonfasting  Mammogram  due  in  June     Ua    off azo right now     urine culture ordered    Referral to ortho  eval visc  injections   Rsv and shingrix  ordered

## 2022-03-10 LAB — HGA1C (HEMOGLOBIN A1C WITH EST AVG GLUCOSE): HEMOGLOBIN A1C: 5.3 % (ref 4.0–6.0)

## 2022-03-11 ENCOUNTER — Telehealth (RURAL_HEALTH_CENTER): Payer: Self-pay | Admitting: Internal Medicine

## 2022-03-11 LAB — URINE CULTURE: URINE CULTURE: >100000 — AB

## 2022-03-14 DIAGNOSIS — F325 Major depressive disorder, single episode, in full remission: Secondary | ICD-10-CM | POA: Insufficient documentation

## 2022-03-15 NOTE — Addendum Note (Signed)
Addended by: Otelia Sergeant on: 03/15/2022 08:26 AM     Modules accepted: Orders

## 2022-07-01 IMAGING — MR MRI KNEE LT W/O CONTRAST
6 series · 40 of 40 positions shown · IV contrast (gadolinium)
Comparison: Previous MRI left knee dated 06/19/2021.

﻿EXAM:  22222   MRI KNEE LT W/O CONTRAST
INDICATION: Chronic left knee pain and swelling and diminished range of motion.  No prior surgery. No recent trauma.
TECHNIQUE: Multiplanar, multisequential MRI of the left knee was performed without gadolinium contrast.

[Series 5: PD fat-sat · axial · left · 4.0mm · 0.53mm/px · z∈[-72,+59]mm · 8 of 30 slices shown (1 of 3)]
[im 1/30]
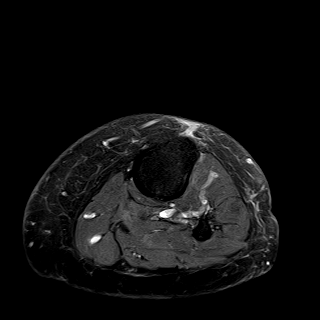
[im 5/30]
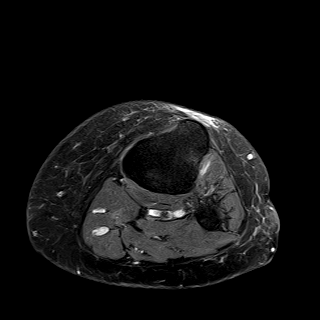
[im 9/30]
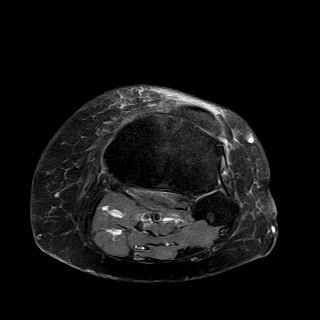
[im 13/30]
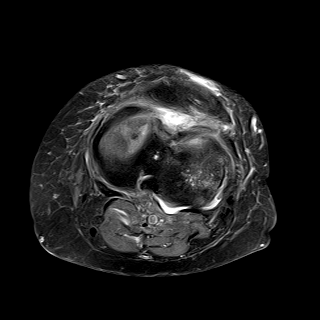
[im 17/30]
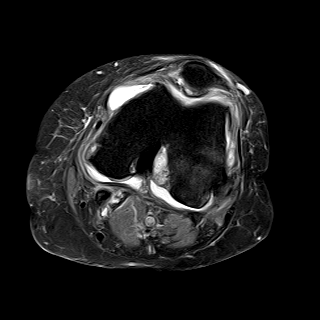
[im 21/30]
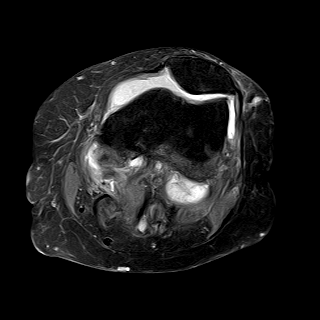
[im 25/30]
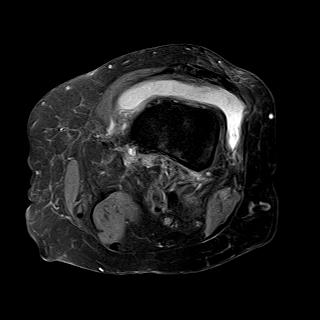
[im 30/30]
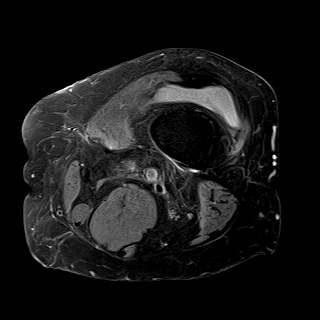

[Series 6: PD fat-sat · sagittal · left · 3.0mm · 0.47mm/px · 8 of 30 slices shown (2 of 3)]
[im 1/30]
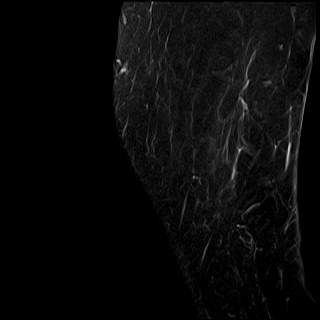
[im 5/30]
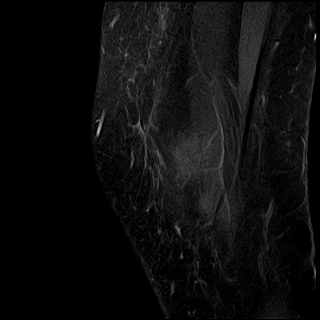
[im 9/30]
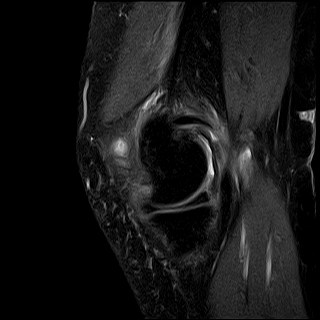
[im 13/30]
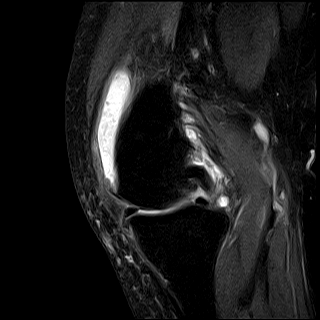
[im 17/30]
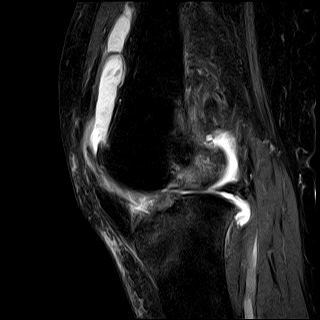
[im 21/30]
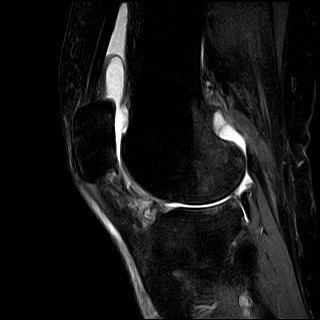
[im 25/30]
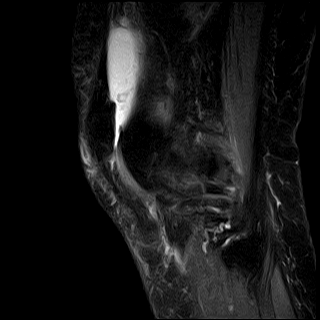
[im 30/30]
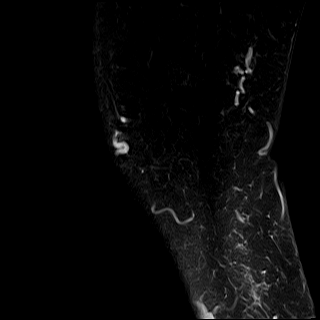

[Series 7: T1 · sagittal · left · 3.0mm · 0.39mm/px · 7 of 30 slices shown]
[im 1/30]
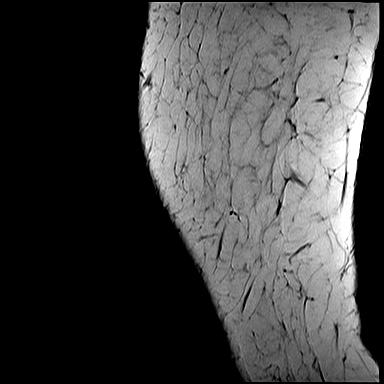
[im 5/30]
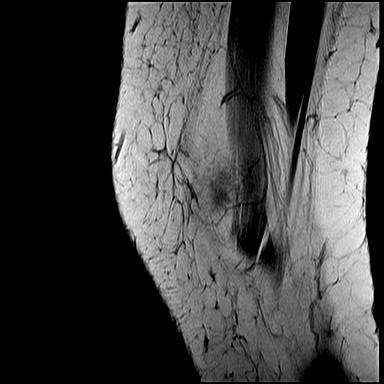
[im 10/30]
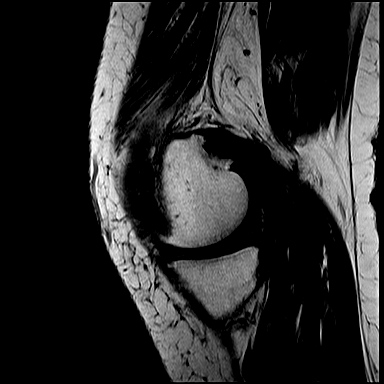
[im 15/30]
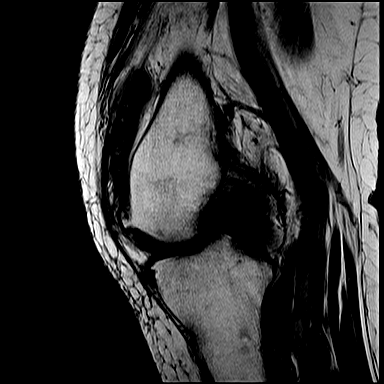
[im 20/30]
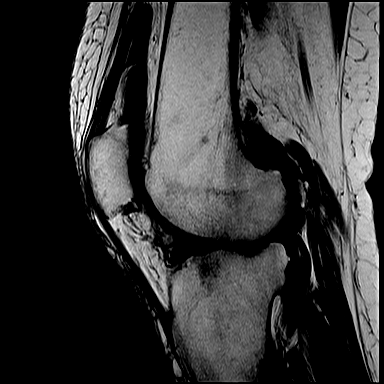
[im 25/30]
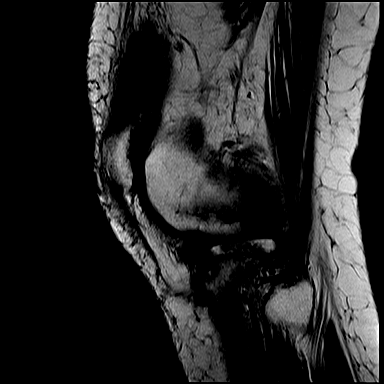
[im 30/30]
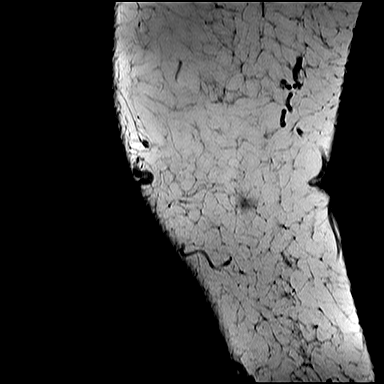

[Series 8: STIR · coronal · left · 3.5mm · 0.47mm/px · 5 of 22 slices shown]
[im 1/22]
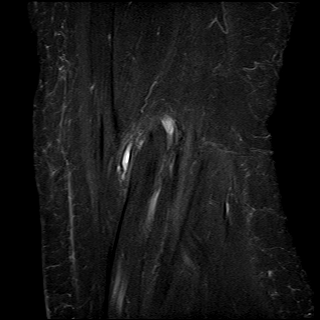
[im 6/22]
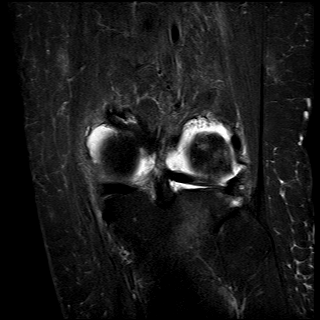
[im 11/22]
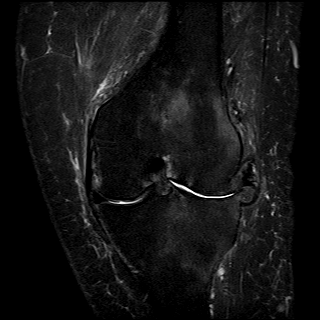
[im 16/22]
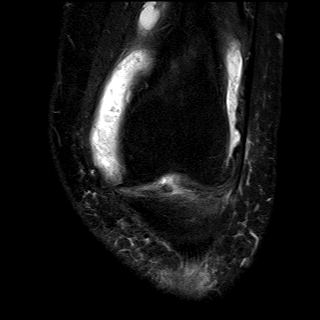
[im 22/22]
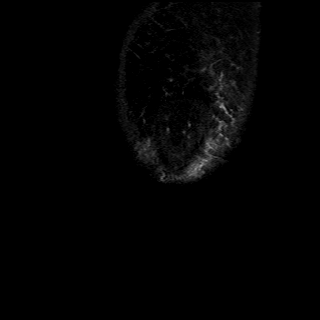

[Series 9: PD fat-sat · coronal · left · 3.5mm · 0.47mm/px · 5 of 22 slices shown (3 of 3)]
[im 1/22]
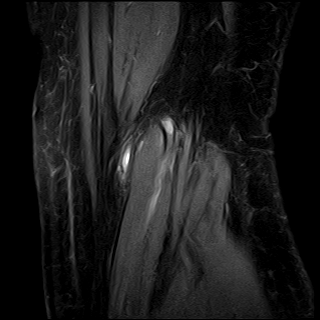
[im 6/22]
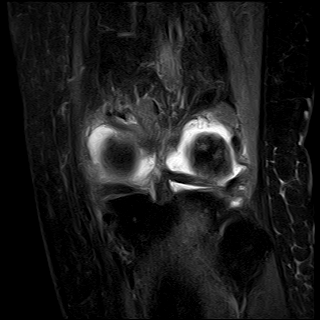
[im 11/22]
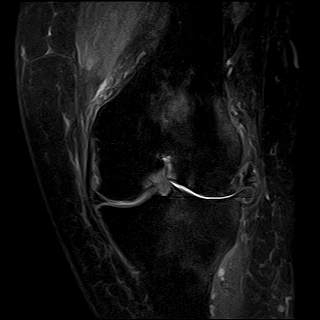
[im 16/22]
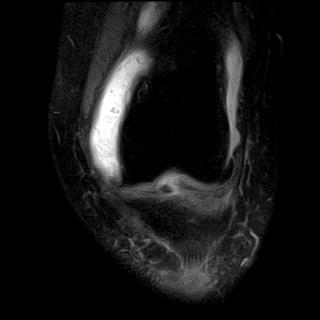
[im 22/22]
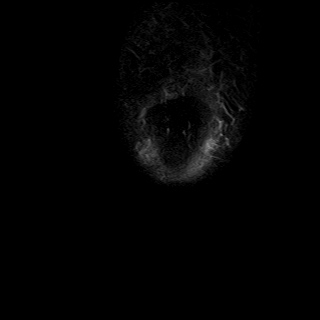

[Series 10: PD · sagittal · left · 3.0mm · 0.47mm/px · 7 of 30 slices shown]
[im 1/30]
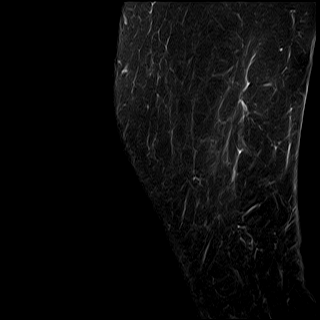
[im 5/30]
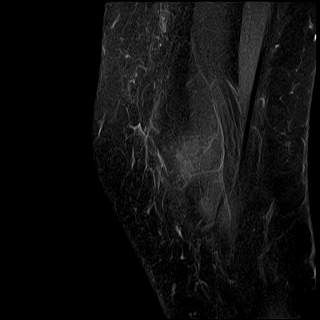
[im 10/30]
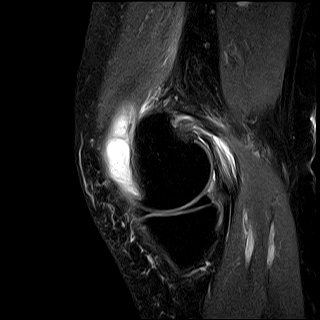
[im 15/30]
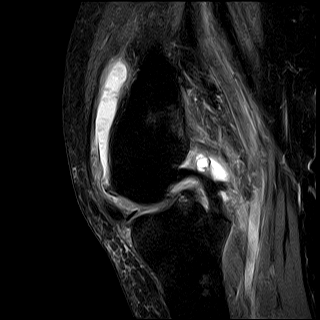
[im 20/30]
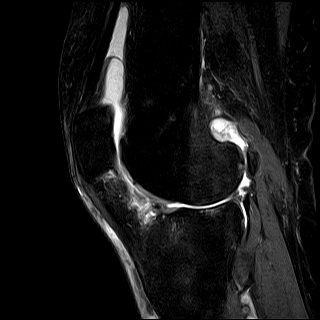
[im 25/30]
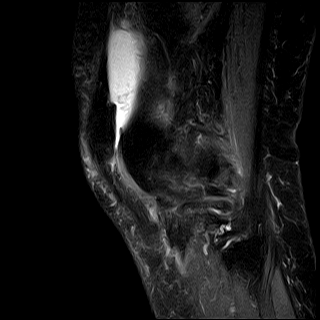
[im 30/30]
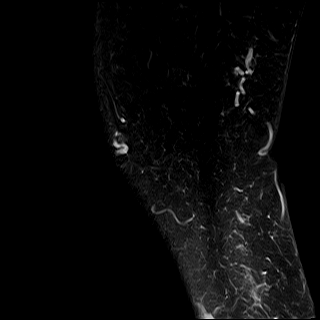

[40 of 40 positions shown; findings below may reference images not displayed]

FINDINGS: No acute bony lesions are seen at the left knee. 

Advanced grade 4 degenerative changes of cartilages of the lateral compartment are noted. Mild subchondral bone marrow edema is noted on both sides of the lateral compartment. 

Lateral meniscus shows advanced chronic degenerative changes with chronic tear with a large portion of the lateral meniscus missing.

Posterior cruciate ligament is intact. Partial-thickness chronic disruption of anterior cruciate ligament is noted.  Mild anterior translocation of proximal tibia in relation to distal femur. 

The medial meniscus shows chronic degenerative changes and chronic tear of mid portion of medial meniscus with cystic change.  Medial extrusion of mid medial meniscus is noted. Meniscal root is intact. 

Collateral ligaments are intact.

Quadriceps tendon and patellar tendon are intact.  Significant grade 3 degenerative changes of patellar articular cartilage especially of the lateral facet.  Large effusion is noted in the knee joint. Small Baker's cyst is noted.  Edema around the knee joint and varicose veins are noted.
IMPRESSION: 1. No acute bony lesions at the left knee. 

2. Advanced grade 4 degenerative changes of lateral articular cartilage with mild subchondral edema.

3. Chronic complex tear with a large portion of lateral meniscus absent.

4. Partial-thickness disruption of the anterior cruciate ligament with minimal anterior translocation of proximal tibia in relation to distal femur. 

5. Chronic tear of the midportion of medial meniscus with medial extrusion of mid medial meniscus.

6. Effusion in the knee joint.

7. The above-mentioned chronic findings are unchanged in appearance from previous examination of 06/19/2021.

## 2022-07-05 ENCOUNTER — Encounter (INDEPENDENT_AMBULATORY_CARE_PROVIDER_SITE_OTHER): Payer: Self-pay | Admitting: Internal Medicine

## 2022-07-12 ENCOUNTER — Other Ambulatory Visit: Payer: Self-pay

## 2022-07-12 ENCOUNTER — Encounter (INDEPENDENT_AMBULATORY_CARE_PROVIDER_SITE_OTHER): Payer: Self-pay | Admitting: Internal Medicine

## 2022-07-12 ENCOUNTER — Ambulatory Visit: Payer: Medicare Other | Attending: Internal Medicine | Admitting: Internal Medicine

## 2022-07-12 DIAGNOSIS — E1165 Type 2 diabetes mellitus with hyperglycemia: Secondary | ICD-10-CM

## 2022-07-12 DIAGNOSIS — D518 Other vitamin B12 deficiency anemias: Secondary | ICD-10-CM

## 2022-07-12 DIAGNOSIS — F411 Generalized anxiety disorder: Secondary | ICD-10-CM

## 2022-07-12 DIAGNOSIS — M25561 Pain in right knee: Secondary | ICD-10-CM

## 2022-07-12 DIAGNOSIS — I10 Essential (primary) hypertension: Secondary | ICD-10-CM

## 2022-07-12 DIAGNOSIS — L989 Disorder of the skin and subcutaneous tissue, unspecified: Secondary | ICD-10-CM

## 2022-07-12 DIAGNOSIS — E559 Vitamin D deficiency, unspecified: Secondary | ICD-10-CM

## 2022-07-12 DIAGNOSIS — I480 Paroxysmal atrial fibrillation: Secondary | ICD-10-CM

## 2022-07-12 DIAGNOSIS — M25562 Pain in left knee: Secondary | ICD-10-CM

## 2022-07-12 DIAGNOSIS — G2581 Restless legs syndrome: Secondary | ICD-10-CM

## 2022-07-12 DIAGNOSIS — F1020 Alcohol dependence, uncomplicated: Secondary | ICD-10-CM

## 2022-07-12 DIAGNOSIS — I8393 Asymptomatic varicose veins of bilateral lower extremities: Secondary | ICD-10-CM

## 2022-07-12 DIAGNOSIS — F32A Depression, unspecified: Secondary | ICD-10-CM

## 2022-07-12 DIAGNOSIS — Z0000 Encounter for general adult medical examination without abnormal findings: Secondary | ICD-10-CM

## 2022-07-12 DIAGNOSIS — N959 Unspecified menopausal and perimenopausal disorder: Secondary | ICD-10-CM

## 2022-07-12 MED ORDER — AMLODIPINE 5 MG-BENAZEPRIL 40 MG CAPSULE
1.0000 | ORAL_CAPSULE | Freq: Every day | ORAL | 3 refills | Status: DC
Start: 2022-07-12 — End: 2023-09-05

## 2022-07-12 NOTE — Progress Notes (Unsigned)
INTERNAL MEDICINE, BUILDING A  [address]  [place] [address]  Operated by [facility]  Medicare Annual Wellness Visit    Name: Vanessa Tanner MRN:  [id number]   Date: 07/12/2022 Age: 67 y.o.       SUBJECTIVE:   Vanessa Tanner is a 67 y.o. female for presenting for Medicare Wellness exam.   I have reviewed and reconciled the medication list with the patient today.        07/12/2022    11:47 AM 06/30/2021     2:00 PM   Comprehensive Health Assessment-Adult   Do you wish to complete this form? Yes Yes   During the past 4 weeks, how would you rate your health in general? Good Very Good   During the past 4 weeks, how much difficulty have you had doing your usual activities inside and outside your home because of medical or emotional problems? No difficulty at all No difficulty at all   During the past 4 weeks, was someone available to help you if you needed and wanted help? Yes, as much as I wanted Yes, as much as I wanted   In the past year, how many times have you gone to the emergency department or been admitted to a hospital for a health problem? None None   Are you generally satisfied with your sleep? Yes Yes   Do you have enough money to buy things you need in everyday life, such as food, clothing, medicines, and housing? Yes, always Yes, always   Can you get to places beyond walking distance without help?  (For example, can you drive your own car or travel alone on buses)? Yes Yes   Do you fasten your seatbelt when you are in a car? Yes, usually Yes, usually   Do you exercise 20 minutes 3 or more days per week (such as walking, dancing, biking, mowing grass, swimming)? Yes, some of the time No, I usually don't exercise this much   How often do you eat food that is healthy (fruits, vegetables, lean meats) instead of unhealthy (sweets, fast food, junk food, fatty foods)? Most of the time Almost always   Have your parents, brothers or sisters had any of the following problems before the age  of 85? (check all that apply) Heart problems, or hardening of the arteries;High cholesterol Heart problems, or hardening of the arteries;Diabetes (sugar);Cancer   How often do you have trouble taking medicines the eay you are told to take them? I always take them as prescribed    Do you need any help communicating with your doctors and nurses because of vision or hearing problems? No No   During the past 12 months, have you experienced confusion or memory loss that is happening more often or is getting worse? No No   Do you have one person you think of as your personal doctor (primary care provider or family doctor)? Yes Yes   If you are seeing a Primary Care Provider (PCP) or family doctor. please list their name [facility]   Are you now also seeing any specialist physician(s) (such as eye doctor, foot doctor, skin doctor)? Yes No   If you are seeing a specialist for anything such as foot, eye, skin, etc.  please list their name(s) dr Renda Rolls dr azzo dr Chestine Spore in [place]    How confident are you that you can control or manage most of your health problems? Very confident Very confident  I have reviewed and updated as appropriate the past medical, family and social history. 07/12/2022 as summarized below:  Past Medical History:   Diagnosis Date    Alcohol dependence syndrome (CMS HCC)     B12 deficiency     Depressive disorder     Displacement of cervical intervertebral disc without myelopathy     Essential hypertension     Facial lesion     GAD (generalized anxiety disorder)     Hyperglycemia     Hypomagnesemia     Knee pain, bilateral     Menopausal and postmenopausal disorder     Paroxysmal A-fib (CMS HCC)     RLS (restless legs syndrome)     Varicose veins of lower extremity     vein injection    Dr  Ladoris Gene    Vitamin D deficiency     Vitamin D deficiency      History reviewed. No pertinent surgical history.  Current Outpatient Medications   Medication Sig    Amlodipine-Benazepril 5-40 mg Oral  Capsule TAKE 1 CAPSULE BY MOUTH EVERY DAY    apixaban (ELIQUIS) 5 mg Oral Tablet Take by mouth Twice daily    calcium carbonate 600 mg calcium (1,500 mg) Oral Tablet Take 1 Tablet (600 mg total) by mouth Once a day    Cholecalciferol, Vitamin D3, 25 mcg (1,000 unit) Oral Capsule Take 1 Capsule (1,000 Units total) by mouth Once a day    cyanocobalamin (VITAMIN B 12) 1,000 mcg Oral Tablet Take 1 Tablet (1,000 mcg total) by mouth Once a day    EPINEPHrine 0.3 mg/0.3 mL Injection Auto-Injector Inject 0.3 mL (0.3 mg total) into the muscle Once, as needed    magnesium oxide (MAG-OX) 400 mg Oral Tablet Take 1 Tablet (400 mg total) by mouth Once a day    multivitamin with iron Oral Tablet Take 1 Tablet by mouth Once a day    psyllium husk 3.4 gram/5.4 gram Oral Powder Take by mouth     Family Medical History:       Problem Relation (Age of Onset)    Arthritis-osteo Brother    CKD Sister    Coronary Artery Disease Brother, Brother    Diabetes Sister    Diabetes type II Sister, Brother    Drug Abuse Brother    HTN <20 y.o. Mother, Brother    Lung disease Father    Osteoporosis Mother    Parkinsons Disease Brother            Social History     Socioeconomic History    Marital status: Single   Tobacco Use    Smoking status: Former     Types: Cigarettes    Smokeless tobacco: Never   Vaping Use    Vaping status: Never Used   Substance and Sexual Activity    Alcohol use: Yes    Drug use: Never     Social Determinants of Health     Financial Resource Strain: Low Risk  (06/30/2021)    Financial Resource Strain     SDOH Financial: No   Transportation Needs: Low Risk  (06/30/2021)    Transportation Needs     SDOH Transportation: No   Social Connections: Low Risk  (06/30/2021)    Social Connections     SDOH Social Isolation: 5 or more times a week   Intimate Partner Violence: Low Risk  (06/30/2021)    Intimate Partner Violence     SDOH Domestic Violence: No   Housing  Stability: Low Risk  (06/30/2021)    Housing Stability     SDOH Housing  Situation: I have housing.     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   Care Team       PCP       Name Type Specialty Phone Number    Samuella Cota, [place] Physician Assistant PHYSICIAN ASSISTANT [phone]              Care Team       No care team found                      Health Maintenance   Topic Date Due    Hepatitis C screening  Never done    Shingles Vaccine (1 of 2) Never done    Pneumococcal Vaccination, Age 65+ (1 of 1 - PCV) Never done    [employer] Annual Wellness Visit - Calendar Year Insurers  01/18/2022    Covid-19 Vaccine (1 - 2023-24 season) 10/10/2022 (Originally 09/18/2021)    Influenza Vaccine (Season Ended) 09/19/2022    Mammography  06/20/2023    Colonoscopy  06/06/2025    Adult Tdap-Td (2 - Td or Tdap) 02/18/2030    Osteoporosis screening  04/24/2030    Meningococcal Vaccine  Aged Out     Medicare Wellness Assessment   Medicare initial or wellness physical in the last year?: Yes  Advance Directives   Does patient have a living will or MPOA: No           Advance directive information given to the patient today?: Patient Declined      Activities of Daily Living   Do you need help with dressing, bathing, or walking?: No   Do you need help with shopping, housekeeping, medications, or finances?: No   Do you have rugs in hallways, broken steps, or poor lighting?: No   Do you have grab bars in your bathroom, non-slip strips in your tub, and hand rails on your stairs?: Yes   Cognitive Function Screen (1=Yes, 0=No)   What is you age?: Correct   What is the time to the nearest hour?: Correct   What is the year?: Correct   What is the name of this clinic?: Correct   Can the patient recognize two persons (the doctor, the nurse, home help, etc.)?: Correct   What is the date of your  birth? (day and month sufficient) : Correct   In what year did World War II end?: Incorrect   Who is the current president of the [place]?: Correct   Count from 20 down to 1?: Correct   What address did I give you earlier?: Correct   Total Score: 9   Interpretation of Total Score: Greater than 6 Normal   Fall Risk Screen   Do you feel unsteady when standing or walking?: Yes  Do you worry about falling?: Yes  Have you fallen in the past year?: No   Depression Screen     Little interest or pleasure in doing things.: Not at all  Feeling down, depressed, or hopeless: Not at all  PHQ 2 Total: 0     Pain Score  Pain Score:   0 - No pain    Substance Use-Abuse Screening     Tobacco Use     In Past 12 MONTHS, how often have you used any tobacco product (for example, cigarettes, e-cigarettes, cigars, pipes, or smokeless tobacco)?: Never     Alcohol use     In the PAST 12 MONTHS, how often have you had 5 (men)/4 (women) or more drinks containing alcohol in one day?: Daily  In the PAST 3 MONTHS, did you have a drink containing alcohol?: Yes   In the PAST 3 MONTHS, did you have 5 (men)/4 (women) or more drinks containing alcohol in a day?: Yes  In the PAST 3 MONTHS, have you tried and failed to control, cut down or stop drinking?: No  In the PAST 3 MONTHS, has anyone expressed concern about your drinking?: No     Prescription Drug Use     In the PAST 12 months, how often have you used any prescription medications just for the feeling, more than prescribed, or that were not prescribed for you? Prescriptions may include: opioids, benzodiazepines, medications for ADHD: Never           Illicit Drug Use   In the PAST 12 MONTHS, how often have you used any drugs, including marijuana, cocaine or crack, heroin, methamphetamine, hallucinogens, ecstasy/MDMA?: Never            Urine Incontinence Screen   Urinary Incontinence Screen  Do you ever leak urine when you don't want to?: No        OBJECTIVE:   There were no vitals taken  for this visit.       Other appropriate exam:  Exam  Const  General: cooperative, no acute distress and alert  Resp  Effort & Inspection: able to speak in complete sentences  Psych  Mental Status: mental status grossly normal  Mood: congruent mood  Affect: normal affect  Attitude: cooperative  Insight: insight good  Judgment: judgment good     Health Maintenance Due   Topic Date Due    Hepatitis C screening  Never done    Shingles Vaccine (1 of 2) Never done    Pneumococcal Vaccination, Age 37+ (1 of 1 - PCV) Never done    [employer] Wellness Visit - Calendar Year Insurers  01/18/2022      ASSESSMENT & PLAN:   Assessment/Plan   1. Medicare annual wellness visit, subsequent       Identified Risk Factors/ Recommended Actions     Fall Risk Follow up plan of care: Discussed optimizing home safety  pt needs  knee replacement            The PHQ 2 Total: 0 depression screen is interpreted as negative.        Patient declined Advanced Directives information.        No orders of the defined types were placed in this encounter.         The patient has been educated about risk factors and recommended preventive care. Written Prevention Plan completed/ updated and given to patient (see After Visit Summary).    Meds reviewed as well as labs.  Chart reviewed and updated.   Continue current treatment.  Keep follow-up appointment.   Vaccine hx reviewed.   BP was up but could not  take  spironolactone   Time spent with pt on phone     Mammogram coming up   I personally offered the service to  the patient, and obtained verbal consent to provide this service.    Samuella Cota, PA-C

## 2022-07-12 NOTE — Nursing Note (Signed)
Medicare wellness

## 2022-07-12 NOTE — Patient Instructions (Signed)
Medicare Preventive Services  Medicare coverage information Recommendation for YOU   Heart Disease and Diabetes   Lipid profile Every 5 years or more often if at risk for cardiovascular disease     Lab Results   Component Value Date    CHOLESTEROL 197 03/09/2022    HDLCHOL 91 03/09/2022    LDLCHOL 93 03/09/2022    TRIG 65 03/09/2022           Diabetes Screening    Yearly for those at risk for diabetes, 2 tests per year for those with prediabetes Last Glucose: 101    Diabetes Self Management Training or Medical Nutrition Therapy  For those with diabetes, up to 10 hrs initial training within a year, subsequent years up to 2 hrs of follow up training Optional for those with diabetes     Medical Nutrition Therapy  Three hours of one-on-one counseling in first year, two hours in subsequent years Optional for those with diabetes, kidney disease   Intensive Behavioral Therapy for Obesity  Face-to-face counseling, first month every week, month 2-6 every other week, month 7-12 every month if continued progress is documented Optional for those with Body Mass Index 30 or higher  Your There is no height or weight on file to calculate BMI.   Tobacco Cessation (Quitting) Counseling   Covers up to 8 smoking and tobacco-use cessation counseling sessions in a 43-month period.    Optional for those that use tobacco   Cancer Screening Last Completion Date   Colorectal screening   For anyone age 74 to 13 or any age if high risk:  Screening Colonoscopy every 10 yrs if low risk,  more frequent if higher risk  OR  Cologuard Stool DNA test once every 3 years OR  Fecal Occult Blood Testing yearly OR  Flexible  Sigmoidoscopy  every 5 yr OR  CT Colonography every 5 yrs      See below for due date if applicable.   Screening Pap Test   Recommended every 3 years for all women age 72 to 46, or every five years if combined with HPV test (routine screening not needed after total hysterectomy).  Medicare covers every 2 years or yearly if high  risk.  Screening Pelvic Exam   Medicare covers every 2 years, yearly if high risk or childbearing age with abnormal Pap in last 3 yrs.     See below for due date if applicable.   Screening Mammogram   Recommended every 2 years for women age 44 to 30, or more frequent if you have a higher risk. Selectively recommended for women between 40-49 based on shared decisions about risk. Covered by Medicare up to every year for women age 90 or older --06/19/2021  See below for due date if applicable.         Lung Cancer Screening  Annual low dose computed tomography (LDCT scan) is recommended for those age 46-80 who smoked 20 pack-years and are current smokers or quit smoking within past 15 years, after counseling by your doctor or nurse clinician about the possible benefits or harms.     See below for due date if applicable.   Vaccinations   Respiratory syncytial virus (RSV)  Age 23 years or older: Based on shared clinical decision-making with your provider.  Pneumococcal Vaccine  Recommended routinely age 56+ with one or two separate vaccines based on your risk. Recommended before age 17 if medical conditions with increased risk  Seasonal Influenza Vaccine  Once every  flu season   Hepatitis B Vaccine  3 doses if risk (including anyone with diabetes or liver disease)  Shingles Vaccine  Two doses at age 100 or older  Diphtheria Tetanus Pertussis Vaccine  ONCE as adult, booster every 10 years     Immunization History   Administered Date(s) Administered   . DIPTH,PERTUSSIS-ACEL,TETANUS >10 YRS OLD 02/19/2020     Shingles vaccine and Diphtheria Tetanus Pertussis vaccines are available at pharmacies or local health department without a prescription.   Other Preventative Screening  Last Completion Date   Bone Densitometry   Screening: All females ages 25 and older every 10 years if initial screening normal. Postmenopausal women ages 60-64 need screening with one or more risk factor: previous fracture, parental hip fracture, current  smoker, low body weight, excessive alcohol use, Rheumatoid Arthritis   For women with diagnosed Osteoporosis, follow up is recommended every 2 years or a frequency recommended by your provider.     --04/23/2020  See below for due date if applicable.     Glaucoma Screening   Yearly if in high risk group such as diabetes, family history, African American age 59+ or Hispanic American age 24+   See your eye care provider for screening.   Hepatitis C Screening   Recommended  for those born between ages 18-79 years.     See below for due date if applicable.     HIV Testing  Recommended routinely at least ONCE, covered every year for age 45 to 50 regardless of risk, and every year for age over 49 who ask for the test or higher risk. Yearly or up to 3 times in pregnancy         See below for due date if applicable.   Abdominal Aortic Aneurysm Screening Ultrasound   Once with a family history of abdominal aortic aneurysms OR a female between65-75 and have smoked at least 100 cigarettes in your lifetime.         See below for due date if applicable.       Your Personalized Schedule for Preventive Tests   Health Maintenance: Pending and Last Completed       Date Due Completion Date    Hepatitis C screening Never done ---    Shingles Vaccine (1 of 2) Never done ---    Pneumococcal Vaccination, Age 4+ (1 of 1 - PCV) Never done ---    Medicare Annual Wellness Visit - Calendar Year Insurers 01/18/2022 06/30/2021    Covid-19 Vaccine (1 - 2023-24 season) 10/10/2022 (Originally 09/18/2021) ---    Influenza Vaccine (Season Ended) 09/19/2022 ---    Mammography 06/20/2023 06/19/2021    Colonoscopy 06/06/2025 ---    Adult Tdap-Td (2 - Td or Tdap) 02/18/2030 02/19/2020    Osteoporosis screening 04/24/2030 04/23/2020 (Done)    Override on 04/23/2020: Done                For Information on Advanced Directives for Health Care:  [place]:  [email or web address]  [place], [place], [place], [place] General Information:  [email or web address]

## 2022-07-13 ENCOUNTER — Encounter (INDEPENDENT_AMBULATORY_CARE_PROVIDER_SITE_OTHER): Payer: Self-pay | Admitting: Internal Medicine

## 2022-08-11 ENCOUNTER — Telehealth (INDEPENDENT_AMBULATORY_CARE_PROVIDER_SITE_OTHER): Payer: Self-pay | Admitting: Internal Medicine

## 2022-08-11 DIAGNOSIS — I10 Essential (primary) hypertension: Secondary | ICD-10-CM

## 2022-08-11 DIAGNOSIS — R0789 Other chest pain: Secondary | ICD-10-CM

## 2022-08-11 DIAGNOSIS — I480 Paroxysmal atrial fibrillation: Secondary | ICD-10-CM

## 2022-08-13 IMAGING — MG 3D SCREENING MAMMO BIL AND TOMO
3 series · 7 of 24 positions shown · non-contrast
Comparison: 07/22/2022, 05/20/2020.

------------- REPORT [id number] -------------
[facility]
[id number]
We wish to report the following on your recent mammography examination. We are sending a report to your referring physician or other health care provider.
INDICATION: Screening mammogram.  Asymptomatic 66-year-old with no family history.  Lifetime breast cancer risk 7.6%.

[R CC tomo · right · 0.10mm/px · 4 of 4 slices shown]
[im 1/4]
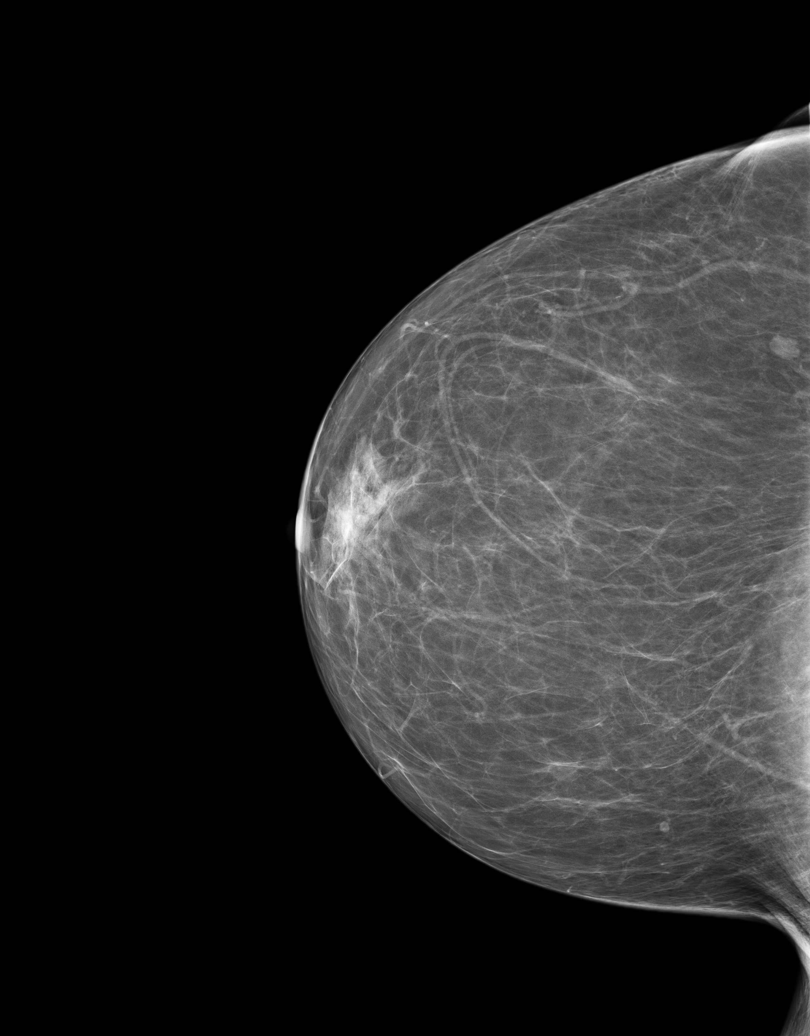
[im 2/4]
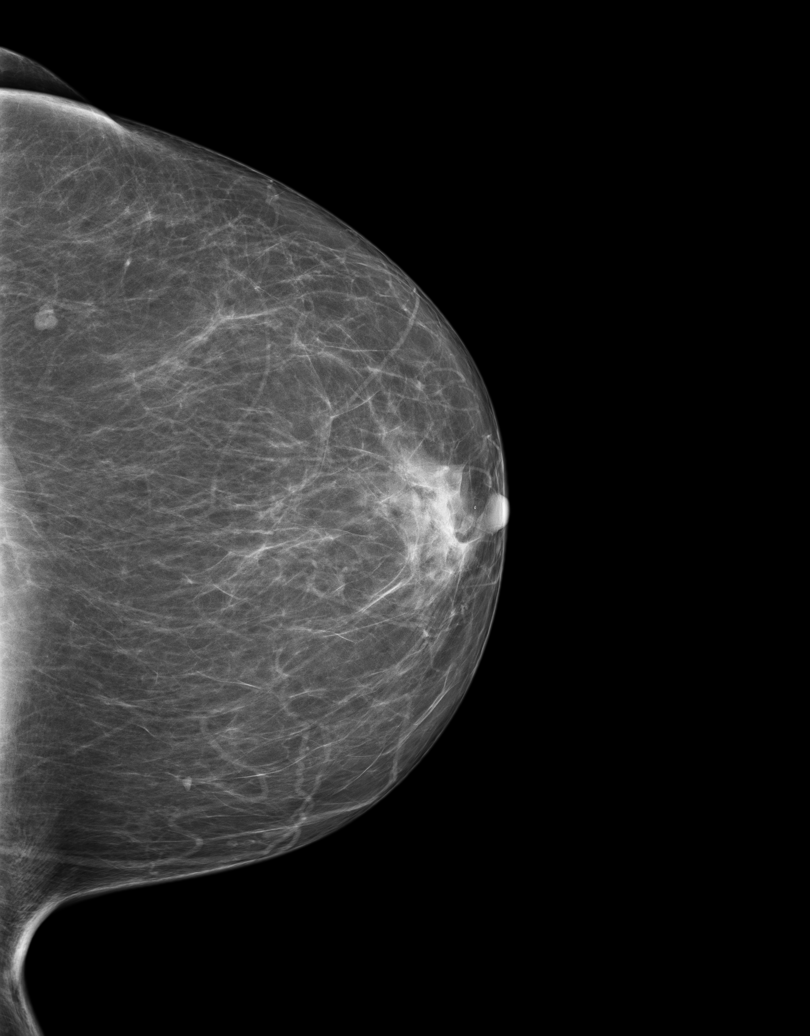
[im 3/4]
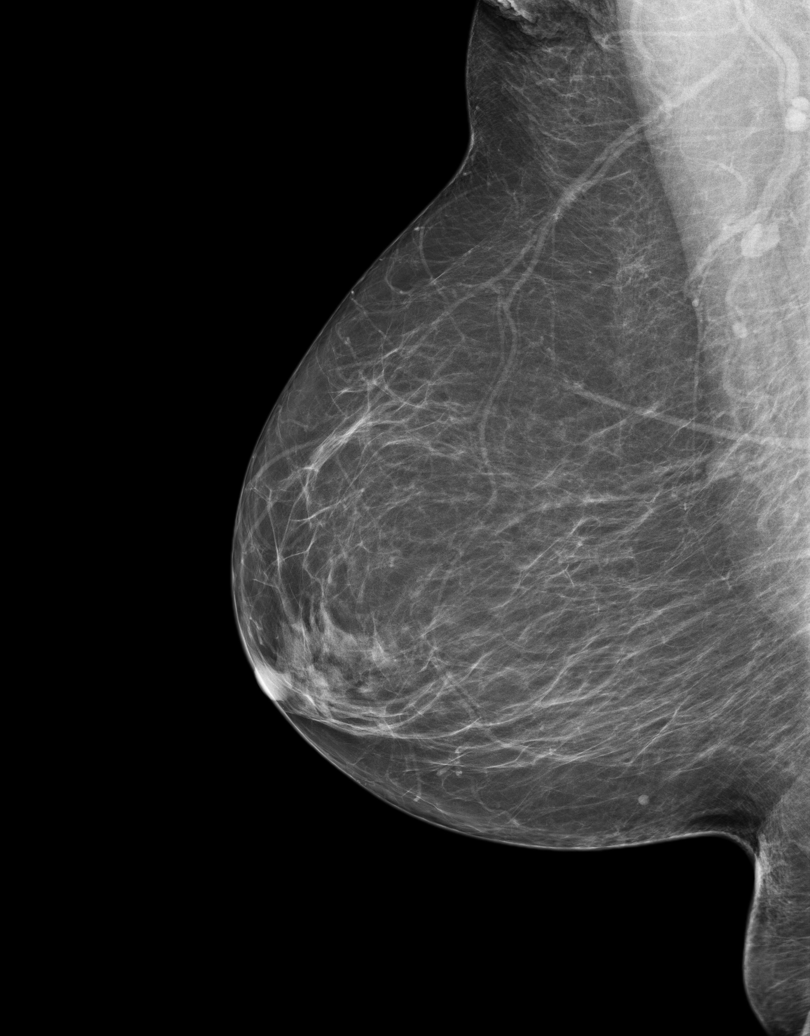
[im 4/4]
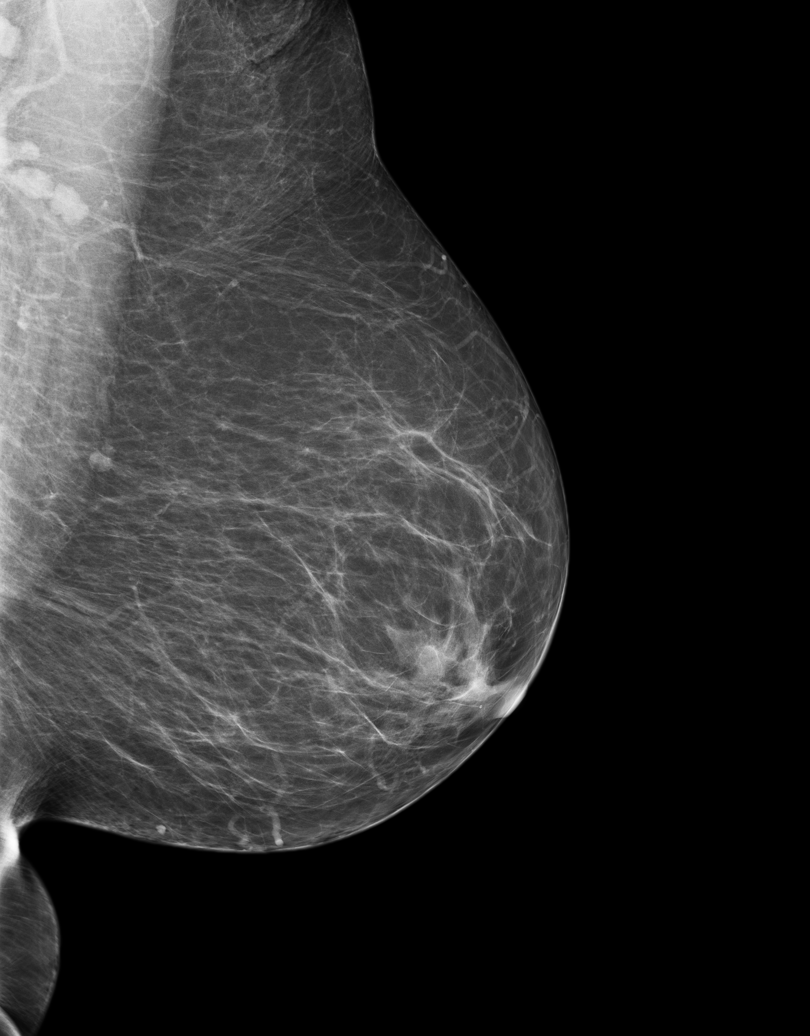

[3D SCREENING MAMMO BIL AND TOMO tomo · 2 acquisitions, 2 frames shown (1 of 2)]
[im 1/2]
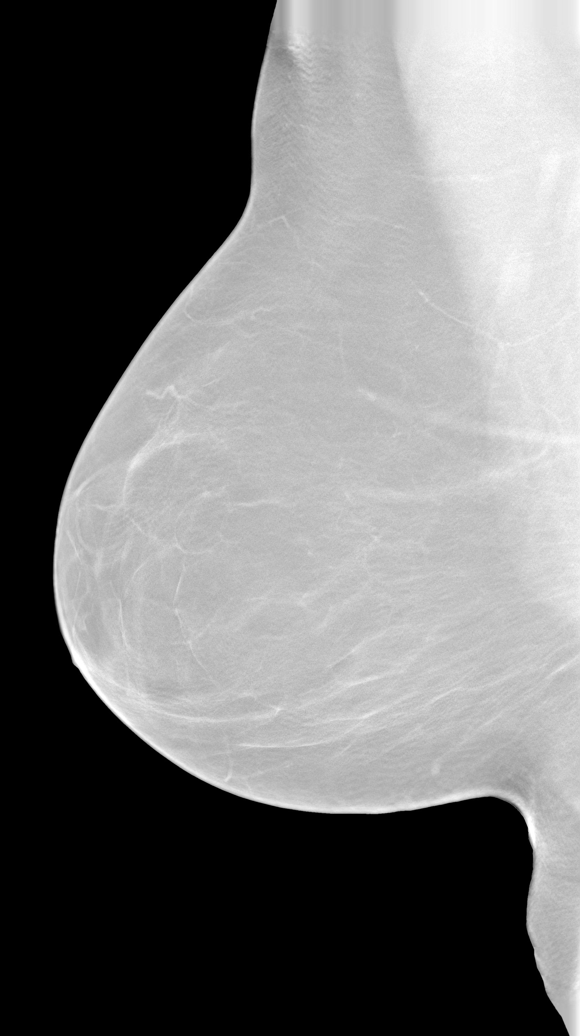
[im 2/2]
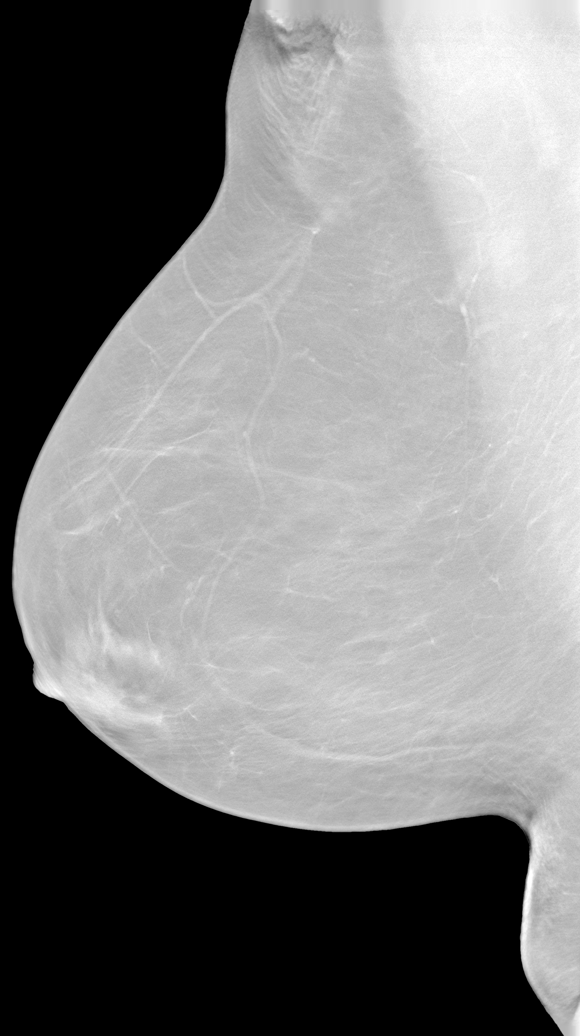

[3D SCREENING MAMMO BIL AND TOMO tomo (2 of 2) · tomo slice 11/69.0]
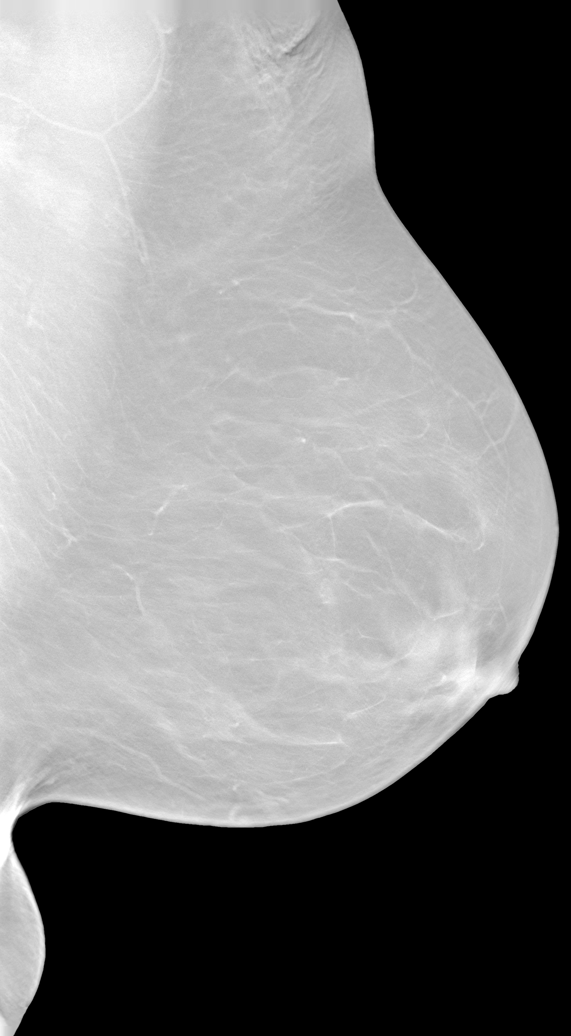

[7 of 24 positions shown; findings below may reference images not displayed]

FINDING: Normal-no evidence of cancer

This statement is mandated by the [place], Department of Health.
Your examination was performed by one of our technologists, who are registered radiological technologists and also specially certified in mammography:
___
[place], [place] (M)

Your mammogram was interpreted by our radiologist.

( 
Amore Pho, M.D.

(Annual Breast Examination by a physician or other health care provider
(Annual Mammography Screening beginning at age 40
(Monthly Breast Self Examination

------------- REPORT [id number] -------------
﻿

EXAM:  3D SCREENING MAMMO BIL AND TOMO
FINDINGS: There are scattered fibroglandular elements.  There is no mass or suspicious cluster of microcalcifications.   There is no architectural distortion, skin thickening or nipple retraction.
IMPRESSION: 1.  BIRADS 2-Benign findings. Patient has been added in a reminder system with a target date for the next screening mammography.

2.  DENSITY CODE – B (Scattered areas of fibroglandular density)  

Final Assessment Code:

BI-RADS 0
 Need additional imaging evaluation.

BI-RADS 1
 Negative mammogram.

BI-RADS 2
 Benign finding.

BI-RADS 3
 Probably benign finding; short-interval follow-up suggested.

BI-RADS 4
 Suspicious abnormality; biopsy should be considered.

BI-RADS 5
 Highly suggestive of malignancy; appropriate action should be taken.

BI-RADS 6
 Known biopsy-proven malignancy; appropriate action should be taken.

NOTE:
In compliance with Federal regulations, the results of this mammogram are being sent to the patient.

## 2022-08-16 ENCOUNTER — Other Ambulatory Visit (INDEPENDENT_AMBULATORY_CARE_PROVIDER_SITE_OTHER): Payer: Self-pay

## 2022-08-16 DIAGNOSIS — R739 Hyperglycemia, unspecified: Secondary | ICD-10-CM

## 2022-08-16 DIAGNOSIS — I10 Essential (primary) hypertension: Secondary | ICD-10-CM

## 2022-08-16 DIAGNOSIS — G2581 Restless legs syndrome: Secondary | ICD-10-CM

## 2022-08-16 DIAGNOSIS — I480 Paroxysmal atrial fibrillation: Secondary | ICD-10-CM

## 2022-10-18 ENCOUNTER — Encounter (INDEPENDENT_AMBULATORY_CARE_PROVIDER_SITE_OTHER): Payer: Self-pay | Admitting: Internal Medicine

## 2022-10-18 ENCOUNTER — Telehealth (INDEPENDENT_AMBULATORY_CARE_PROVIDER_SITE_OTHER): Payer: Self-pay | Admitting: Internal Medicine

## 2022-10-18 DIAGNOSIS — G8929 Other chronic pain: Secondary | ICD-10-CM

## 2022-10-18 DIAGNOSIS — M179 Osteoarthritis of knee, unspecified: Secondary | ICD-10-CM

## 2022-10-18 NOTE — Telephone Encounter (Signed)
Referral to  [facility]

## 2022-10-26 ENCOUNTER — Telehealth (INDEPENDENT_AMBULATORY_CARE_PROVIDER_SITE_OTHER): Payer: Self-pay | Admitting: Internal Medicine

## 2022-10-27 ENCOUNTER — Other Ambulatory Visit (INDEPENDENT_AMBULATORY_CARE_PROVIDER_SITE_OTHER): Payer: Self-pay | Admitting: Internal Medicine

## 2022-10-27 ENCOUNTER — Telehealth (INDEPENDENT_AMBULATORY_CARE_PROVIDER_SITE_OTHER): Payer: Self-pay | Admitting: Internal Medicine

## 2022-10-27 DIAGNOSIS — M179 Osteoarthritis of knee, unspecified: Secondary | ICD-10-CM

## 2022-10-27 DIAGNOSIS — M25562 Pain in left knee: Secondary | ICD-10-CM

## 2022-11-04 ENCOUNTER — Telehealth (INDEPENDENT_AMBULATORY_CARE_PROVIDER_SITE_OTHER): Payer: Self-pay | Admitting: Internal Medicine

## 2022-11-15 ENCOUNTER — Ambulatory Visit (INDEPENDENT_AMBULATORY_CARE_PROVIDER_SITE_OTHER): Payer: Self-pay | Admitting: Internal Medicine

## 2022-11-16 ENCOUNTER — Encounter (INDEPENDENT_AMBULATORY_CARE_PROVIDER_SITE_OTHER): Payer: Self-pay | Admitting: Internal Medicine

## 2022-11-16 ENCOUNTER — Ambulatory Visit: Payer: Medicare Other | Attending: Internal Medicine | Admitting: Internal Medicine

## 2022-11-16 ENCOUNTER — Other Ambulatory Visit: Payer: Self-pay

## 2022-11-16 VITALS — BP 120/82 | HR 81 | Temp 98.8°F | Ht 63.0 in | Wt 180.0 lb

## 2022-11-16 DIAGNOSIS — I10 Essential (primary) hypertension: Secondary | ICD-10-CM | POA: Insufficient documentation

## 2022-11-16 DIAGNOSIS — I480 Paroxysmal atrial fibrillation: Secondary | ICD-10-CM | POA: Insufficient documentation

## 2022-11-16 DIAGNOSIS — E559 Vitamin D deficiency, unspecified: Secondary | ICD-10-CM | POA: Insufficient documentation

## 2022-11-16 DIAGNOSIS — R739 Hyperglycemia, unspecified: Secondary | ICD-10-CM | POA: Insufficient documentation

## 2022-11-16 DIAGNOSIS — E538 Deficiency of other specified B group vitamins: Secondary | ICD-10-CM | POA: Insufficient documentation

## 2022-11-16 MED ORDER — SHINGRIX (PF) 50 MCG/0.5 ML INTRAMUSCULAR SUSPENSION, KIT
0.5000 mL | INHALATION_SUSPENSION | Freq: Once | INTRAMUSCULAR | 1 refills | Status: AC
Start: 2022-11-16 — End: 2022-11-16

## 2022-11-16 NOTE — Progress Notes (Signed)
INTERNAL MEDICINE, BUILDING A  [address]  [place] [address]  Operated by [facility]      Name: Vanessa Tanner                       Date of Birth: [birth year]   MRN:  [id number]                         Date of visit: 11/16/2022     PCP: Samuella Cota, PA-C     Subjective  Vanessa Tanner is a 67 y.o. year old female who presents for Follow Up 4 Months (4 month follow up pt states that she got a shot in her left knee leg today and he did tell her that she could get knee replacement but she is putting it off for now,she had a tooth fixed yesterday and she saw dr Lana Fish on the 17th sept and she said he is keeping medications the same at this time pt states that she is not having any problems at the time)   to clinic.  No specialty comments available.   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     vein injection    Dr  Ladoris Gene      Vitamin D deficiency 06/30/2021      Current Outpatient Medications   Medication Sig    Amlodipine-Benazepril 5-40 mg Oral Capsule Take 1 Capsule by mouth Once a day    apixaban (ELIQUIS) 5 mg Oral Tablet Take by mouth Twice daily    calcium carbonate 600 mg calcium (1,500 mg) Oral Tablet Take 1 Tablet (600 mg total) by mouth Once a day    Cholecalciferol, Vitamin D3, 25 mcg (1,000 unit) Oral Capsule Take 1 Capsule (1,000 Units total) by mouth Once a day    cyanocobalamin  (VITAMIN B 12) 1,000 mcg Oral Tablet Take 1 Tablet (1,000 mcg total) by mouth Once a day    EPINEPHrine 0.3 mg/0.3 mL Injection Auto-Injector Inject 0.3 mL (0.3 mg total) into the muscle Once, as needed    magnesium oxide (MAG-OX) 400 mg Oral Tablet Take 1 Tablet (400 mg total) by mouth Once a day    multivitamin with iron Oral Tablet Take 1 Tablet by mouth Once a day    psyllium husk 3.4 gram/5.4 gram Oral Powder Take by mouth        Chronic Disease Management-AMB  FU  VISIT     FU OA knees bilateral  pt has had injections of steroids and he got a shot in her left knee leg today and he did tell her that she could get knee replacement but she is putting it off for now    she had a tooth fixed yesterday     Pt states she saw dr Lana Fish on the 17th  sept and she said he is keeping medications the same at this time pt states that she is not having any problems     FU HTN  BP HAS BEEN STABLE     FU RLS   STABLE     Follow-up vitamin D deficiency on supplement    Follow up  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 Janey Genta for desensitization Pt stopped tx    she carries  epi pen      Follow-up  Hx of depression stable off all  meds     Follow-up low B12 patient is on B12 supplement    Follow-up low magnesium patient on magnesium supplement 400 daily    Follow-up postmenopausal syndrome    Follow-up history of displacement of intervertebral disc without myelopathy no new complaints    Follow up  History of hyperglycemia labs have been stable          REVIEW OF SYSTEMS:   Review of Systems  General: No fever.  No chills.  No weight changes.  HEENT: No vision changes.  Cardiac: No chest pain. No palpitations.  No dizziness.  No light-headedness.  No near syncope.  Resp: No dyspnea at rest, no dyspnea on exertion; no cough or hemoptysis; no orthopnea or PND.  GI: No N/V. No melena.  No bright red blood per rectum.  Ext: No edema.  No claudication.  Neuro: No focal weakness.  No numbness.  Knee pain   All other ROS  negative.      Objective:   BP 120/82 (Site: Left Arm, Patient Position: Sitting)   Pulse 81   Temp 37.1 C (98.8 F)   Ht 1.6 m (5\' 3" )   Wt 81.6 kg (180 lb)   SpO2 96%   BMI 31.89 kg/m              PHYSICAL EXAM  Physical Exam  Gen: NAD. Alert.   HEENT: PERRL; conjunctivae clear. No JVD or carotid bruit.  Cardiac: RRR with normal S1, S2.   Lungs: Clear to auscultation bilaterally. No rales. No wheezing. No rhonchi.  Abdomen: Soft, non-tender.non-distended  nl bowel sounds    Extremities: No edema. No cyanosis. No clubbing. Stiffness knees    Neurologic:  Grossly intact    Lab Results   Component Value Date    CHOLESTEROL 197 03/09/2022    HDLCHOL 91 03/09/2022    LDLCHOL 93 03/09/2022    TRIG 65 03/09/2022       COMPLETE BLOOD COUNT   Lab Results   Component Value Date    WBC 5.1 03/09/2022    HGB 14.0 03/09/2022    HCT 41.0 03/09/2022    PLTCNT 119 (L) 03/09/2022       DIFFERENTIAL  Lab Results   Component Value Date    PMNS 34 (L) 03/09/2022    LYMPHOCYTES 54 (H) 03/09/2022    MONOCYTES 10 03/09/2022    EOSINOPHIL 1 03/09/2022    BASOPHILS 1 03/09/2022    BASOPHILS 0.00 03/09/2022    PMNABS 1.70 (L) 03/09/2022    LYMPHSABS 2.70 03/09/2022    EOSABS 0.10 03/09/2022    MONOSABS 0.50 03/09/2022        COMPREHENSIVE METABOLIC PANEL   Lab Results   Component Value Date    SODIUM 139 03/09/2022    POTASSIUM 4.9 03/09/2022    CHLORIDE 104 03/09/2022    CO2 28 03/09/2022    ANIONGAP 7 03/09/2022    BUN 16 03/09/2022  CREATININE 0.65 03/09/2022    GLUCOSENF 101 03/09/2022    CALCIUM 10.2 03/09/2022    ALBUMIN 4.5 03/09/2022    TOTALPROTEIN 7.2 03/09/2022    ALKPHOS 69 03/09/2022    AST 29 03/09/2022    ALT 23 03/09/2022            THYROID STIMULATING HORMONE  Lab Results   Component Value Date    TSH 1.015 03/09/2022        Lab Results   Component Value Date    HA1C 5.3 03/09/2022     Lab Results   Component Value Date    VITD 67 03/09/2022         No orders of the defined types were placed in this encounter.      Assessment & Plan  Paroxysmal A-fib (CMS HCC)  Stable on Eliquis  5mg   bid    Keep fu with Dr Lana Fish   Hypomagnesemia  Continue on Mg ox  400 mg daily     Essential hypertension  BP stable  continue  Amlodipine/benazepril  5/40 mg  one  daily    Vitamin D deficiency  Continue  vit D3  1000 daily    Hyperglycemia  Low carb low sugar  diet      Fu aic    B12 deficiency  Continue  B12 oral tablet 100 mg daily        Meds reviewed as well as labs.  Chart reviewed and updated.   Continue current treatment.  Keep follow-up appointment.   Vaccine hx reviewed.   S/p injection  gel  but did not work  now back to  [employer]  for another  injection  11/16/22     And considering  knee surgery

## 2022-11-16 NOTE — Nursing Note (Signed)
4 month follow up pt states that she got a shot in her left knee leg today and he did tell her that she could get knee replacement but she is putting it off for now,she had a tooth fixed yesterday and she saw dr Lana Fish on the 17th sept and she said he is keeping medications the same at this time pt states that she is not having any problems at the time

## 2022-11-17 ENCOUNTER — Other Ambulatory Visit: Payer: Self-pay

## 2022-11-17 ENCOUNTER — Other Ambulatory Visit (INDEPENDENT_AMBULATORY_CARE_PROVIDER_SITE_OTHER): Payer: Self-pay | Admitting: Internal Medicine

## 2022-11-17 ENCOUNTER — Telehealth (INDEPENDENT_AMBULATORY_CARE_PROVIDER_SITE_OTHER): Payer: Self-pay | Admitting: Internal Medicine

## 2022-11-17 DIAGNOSIS — E875 Hyperkalemia: Secondary | ICD-10-CM

## 2022-11-17 LAB — MANUAL DIFFERENTIAL
LYMPHOCYTE %: 19 % — ABNORMAL LOW (ref 25–45)
LYMPHOCYTE ABSOLUTE: 1.08 10*3/uL — ABNORMAL LOW (ref 1.10–5.00)
LYMPHOCYTES MANUAL: 19
MONOCYTE %: 3 % (ref 0–12)
MONOCYTE ABSOLUTE: 0.17 10*3/uL (ref 0.00–1.30)
MONOCYTES MANUAL: 3
NEUTROPHIL %: 78 % — ABNORMAL HIGH (ref 40–76)
NEUTROPHIL ABSOLUTE: 4.45 10*3/uL (ref 1.80–8.40)
NEUTROPHILS MANUAL: 78
PLATELET MORPHOLOGY COMMENT: NORMAL
RBC MORPHOLOGY COMMENT: NORMAL
TOTAL CELLS COUNTED [#] IN BLOOD: 100
WBC: 5.7 10*3/uL

## 2022-11-17 LAB — COMPREHENSIVE METABOLIC PNL, FASTING
ALBUMIN/GLOBULIN RATIO: 1.8 — ABNORMAL HIGH (ref 0.8–1.4)
ALBUMIN: 4.7 g/dL (ref 3.5–5.7)
ALKALINE PHOSPHATASE: 63 U/L (ref 34–104)
ALT (SGPT): 13 U/L (ref 7–52)
ANION GAP: 8 mmol/L (ref 4–13)
AST (SGOT): 16 U/L (ref 13–39)
BILIRUBIN TOTAL: 0.6 mg/dL (ref 0.3–1.0)
BUN/CREA RATIO: 30 — ABNORMAL HIGH (ref 6–22)
BUN: 23 mg/dL (ref 7–25)
CALCIUM, CORRECTED: 9.4 mg/dL (ref 8.9–10.8)
CALCIUM: 10 mg/dL (ref 8.6–10.3)
CHLORIDE: 104 mmol/L (ref 98–107)
CO2 TOTAL: 26 mmol/L (ref 21–31)
CREATININE: 0.76 mg/dL (ref 0.60–1.30)
ESTIMATED GFR: 86 mL/min/{1.73_m2} (ref >59)
GLOBULIN: 2.6 (ref 2.0–3.5)
GLUCOSE: 152 mg/dL — ABNORMAL HIGH (ref 74–109)
OSMOLALITY, CALCULATED: 282 mosm/kg (ref 270–290)
POTASSIUM: 5.4 mmol/L — ABNORMAL HIGH (ref 3.5–5.1)
PROTEIN TOTAL: 7.3 g/dL (ref 6.4–8.9)
SODIUM: 138 mmol/L (ref 136–145)

## 2022-11-17 LAB — LIPID PANEL
CHOL/HDL RATIO: 2
CHOLESTEROL: 231 mg/dL — ABNORMAL HIGH (ref <200)
HDL CHOL: 115 mg/dL (ref >=40)
LDL CALC: 105 mg/dL — ABNORMAL HIGH (ref 0–100)
TRIGLYCERIDES: 57 mg/dL (ref <=150)
VLDL CALC: 11 mg/dL (ref 0–50)

## 2022-11-17 LAB — VITAMIN D 25 TOTAL: VITAMIN D 25, TOTAL: 60.84 ng/mL (ref 30.00–100.00)

## 2022-11-17 LAB — CBC WITH DIFF
HCT: 43.9 % — ABNORMAL HIGH (ref 31.2–41.9)
HGB: 14.9 g/dL — ABNORMAL HIGH (ref 10.9–14.3)
MCH: 32.9 pg — ABNORMAL HIGH (ref 24.7–32.8)
MCHC: 33.9 g/dL (ref 32.3–35.6)
MCV: 97.2 fL — ABNORMAL HIGH (ref 75.5–95.3)
MPV: 8.5 fL (ref 7.9–10.8)
PLATELETS: 211 10*3/uL (ref 140–440)
RBC: 4.51 10*6/uL (ref 3.63–4.92)
RDW: 12.7 % (ref 12.3–17.7)
WBC: 5.7 10*3/uL (ref 3.8–11.8)

## 2022-11-17 LAB — THYROID STIMULATING HORMONE (SENSITIVE TSH): TSH: 0.553 u[IU]/mL (ref 0.450–5.330)

## 2022-11-17 LAB — HGA1C (HEMOGLOBIN A1C WITH EST AVG GLUCOSE): HEMOGLOBIN A1C: 5.3 % (ref 4.0–6.0)

## 2022-11-24 ENCOUNTER — Other Ambulatory Visit (INDEPENDENT_AMBULATORY_CARE_PROVIDER_SITE_OTHER): Payer: Self-pay

## 2022-11-24 DIAGNOSIS — E875 Hyperkalemia: Secondary | ICD-10-CM

## 2022-12-04 NOTE — Assessment & Plan Note (Signed)
Continue  B12 oral tablet 100 mg daily

## 2022-12-04 NOTE — Assessment & Plan Note (Signed)
Stable on Eliquis  5mg   bid    Keep fu with Dr Lana Fish

## 2022-12-04 NOTE — Assessment & Plan Note (Signed)
Continue on Mg ox  400 mg daily

## 2022-12-04 NOTE — Assessment & Plan Note (Signed)
Low carb low sugar  diet      Fu aic

## 2022-12-04 NOTE — Assessment & Plan Note (Signed)
Continue  vit D3  1000 daily

## 2022-12-04 NOTE — Assessment & Plan Note (Signed)
BP stable  continue  Amlodipine/benazepril  5/40 mg  one  daily

## 2022-12-09 ENCOUNTER — Telehealth (INDEPENDENT_AMBULATORY_CARE_PROVIDER_SITE_OTHER): Payer: Self-pay | Admitting: Internal Medicine

## 2022-12-09 DIAGNOSIS — M542 Cervicalgia: Secondary | ICD-10-CM

## 2022-12-10 NOTE — Telephone Encounter (Signed)
Lvm to call back to whom she wants to see

## 2023-02-10 ENCOUNTER — Other Ambulatory Visit: Payer: Self-pay

## 2023-02-10 ENCOUNTER — Telehealth (INDEPENDENT_AMBULATORY_CARE_PROVIDER_SITE_OTHER): Payer: Self-pay | Admitting: Internal Medicine

## 2023-02-10 ENCOUNTER — Ambulatory Visit: Payer: Medicare Other | Attending: Internal Medicine | Admitting: Internal Medicine

## 2023-02-10 ENCOUNTER — Encounter (INDEPENDENT_AMBULATORY_CARE_PROVIDER_SITE_OTHER): Payer: Self-pay | Admitting: Internal Medicine

## 2023-02-10 VITALS — HR 85

## 2023-02-10 DIAGNOSIS — M7551 Bursitis of right shoulder: Secondary | ICD-10-CM | POA: Insufficient documentation

## 2023-02-10 DIAGNOSIS — M179 Osteoarthritis of knee, unspecified: Secondary | ICD-10-CM | POA: Insufficient documentation

## 2023-02-10 DIAGNOSIS — M25569 Pain in unspecified knee: Secondary | ICD-10-CM | POA: Insufficient documentation

## 2023-02-10 DIAGNOSIS — R739 Hyperglycemia, unspecified: Secondary | ICD-10-CM | POA: Insufficient documentation

## 2023-02-10 DIAGNOSIS — M25519 Pain in unspecified shoulder: Secondary | ICD-10-CM | POA: Insufficient documentation

## 2023-02-10 LAB — COMPREHENSIVE METABOLIC PANEL, NON-FASTING
ALBUMIN/GLOBULIN RATIO: 1.7 — ABNORMAL HIGH (ref 0.8–1.4)
ALBUMIN: 4.6 g/dL (ref 3.5–5.7)
ALKALINE PHOSPHATASE: 67 U/L (ref 34–104)
ALT (SGPT): 10 U/L (ref 7–52)
ANION GAP: 9 mmol/L (ref 4–13)
AST (SGOT): 17 U/L (ref 13–39)
BILIRUBIN TOTAL: 0.7 mg/dL (ref 0.3–1.0)
BUN/CREA RATIO: 21 (ref 6–22)
BUN: 13 mg/dL (ref 7–25)
CALCIUM, CORRECTED: 9.6 mg/dL (ref 8.9–10.8)
CALCIUM: 10.1 mg/dL (ref 8.6–10.3)
CHLORIDE: 103 mmol/L (ref 98–107)
CO2 TOTAL: 27 mmol/L (ref 21–31)
CREATININE: 0.63 mg/dL (ref 0.60–1.30)
ESTIMATED GFR: 97 mL/min/{1.73_m2} (ref >59)
GLOBULIN: 2.7 (ref 2.0–3.5)
GLUCOSE: 103 mg/dL (ref 74–109)
OSMOLALITY, CALCULATED: 278 mosm/kg (ref 270–290)
POTASSIUM: 4.4 mmol/L (ref 3.5–5.1)
PROTEIN TOTAL: 7.3 g/dL (ref 6.4–8.9)
SODIUM: 139 mmol/L (ref 136–145)

## 2023-02-10 LAB — MAGNESIUM: MAGNESIUM: 1.8 mg/dL — ABNORMAL LOW (ref 1.9–2.7)

## 2023-02-10 MED ORDER — METHYLPREDNISOLONE ACETATE 80 MG/ML SUSPENSION FOR INJECTION
80.0000 mg | INTRAMUSCULAR | Status: AC
Start: 2023-02-10 — End: 2023-02-10
  Administered 2023-02-10: 80 mg via INTRAMUSCULAR

## 2023-02-10 MED ORDER — PREDNISONE 20 MG TABLET
20.0000 mg | ORAL_TABLET | Freq: Every day | ORAL | 0 refills | Status: AC
Start: 2023-02-10 — End: 2023-02-15

## 2023-02-10 NOTE — Progress Notes (Unsigned)
INTERNAL MEDICINE, BUILDING A  [address]  [place] [address]  Operated by [facility]  Progress Note    Name: Vanessa Tanner MRN:  [id number]   Date: 02/10/2023 DOB:  June 28, 1955 (67 y.o.)              Chief Complaint: Arm Pain (For several months  r arm pain not shoulder or elbow  hurts when not moving)       HPI: Vanessa Tanner is a 68 y.o. female who complains of  arm pain and shoulder pain Middle of Nov started and it is right arm  lower deltoid region        Pain if lifting arm   Tylenol but not  helping  holding up  right arm  can  give  some  relief       Medical massage to start tm with [employer]      To see dentist  due to jaw locking       Allergies:  Allergies   Allergen Reactions    Bee Venom Protein (Honey Bee)      wasps    Spironolactone Diarrhea     Fatigue  and diarrhea         Amlodipine-Benazepril 5-40 mg Oral Capsule, Take 1 Capsule by mouth Once a day  apixaban (ELIQUIS) 5 mg Oral Tablet, Take by mouth Twice daily  calcium carbonate 600 mg calcium (1,500 mg) Oral Tablet, Take 1 Tablet (600 mg total) by mouth Once a day  Cholecalciferol, Vitamin D3, 25 mcg (1,000 unit) Oral Capsule, Take 1 Capsule (1,000 Units total) by mouth Once a day  cyanocobalamin (VITAMIN B 12) 1,000 mcg Oral Tablet, Take 1 Tablet (1,000 mcg total) by mouth Once a day  EPINEPHrine 0.3 mg/0.3 mL Injection Auto-Injector, Inject 0.3 mL (0.3 mg total) into the muscle Once, as needed  magnesium oxide (MAG-OX) 400 mg Oral Tablet, Take 1 Tablet (400 mg total) by mouth Once a day  multivitamin with iron Oral Tablet, Take 1 Tablet by mouth Once a day  psyllium husk 3.4 gram/5.4 gram Oral Powder, Take by mouth    No facility-administered medications prior to visit.       Review of Systems -     OBJECTIVE:  Vitals:    02/10/23 0927   Pulse: 85   SpO2: 97%      Physical Exam            ASSESSMENT:   No diagnosis found.    No orders of the defined types were placed in this encounter.       PLAN:  Treatment per orders . Call or return to clinic prn if these symptoms worsen or fail to improve as anticipated.  Meds reviewed as well as labs.  Chart reviewed and updated.   Continue current treatment.  Keep follow-up appointment.     No follow-ups on file.      Samuella Cota, PA-C

## 2023-02-11 LAB — HGA1C (HEMOGLOBIN A1C WITH EST AVG GLUCOSE): HEMOGLOBIN A1C: 5.2 % (ref 4.0–6.0)

## 2023-03-21 ENCOUNTER — Ambulatory Visit (INDEPENDENT_AMBULATORY_CARE_PROVIDER_SITE_OTHER): Payer: Self-pay | Admitting: Internal Medicine

## 2023-03-29 ENCOUNTER — Telehealth (INDEPENDENT_AMBULATORY_CARE_PROVIDER_SITE_OTHER): Payer: Self-pay | Admitting: Internal Medicine

## 2023-03-29 DIAGNOSIS — M25561 Pain in right knee: Secondary | ICD-10-CM

## 2023-03-29 DIAGNOSIS — M179 Osteoarthritis of knee, unspecified: Secondary | ICD-10-CM

## 2023-04-04 ENCOUNTER — Encounter (INDEPENDENT_AMBULATORY_CARE_PROVIDER_SITE_OTHER): Payer: Self-pay | Admitting: Internal Medicine

## 2023-04-04 ENCOUNTER — Ambulatory Visit: Payer: Self-pay | Attending: Internal Medicine | Admitting: Internal Medicine

## 2023-04-04 ENCOUNTER — Other Ambulatory Visit: Payer: Self-pay

## 2023-04-04 VITALS — BP 138/80 | HR 81 | Ht 63.0 in | Wt 180.2 lb

## 2023-04-04 DIAGNOSIS — M25562 Pain in left knee: Secondary | ICD-10-CM | POA: Insufficient documentation

## 2023-04-04 DIAGNOSIS — R739 Hyperglycemia, unspecified: Secondary | ICD-10-CM | POA: Insufficient documentation

## 2023-04-04 DIAGNOSIS — I10 Essential (primary) hypertension: Secondary | ICD-10-CM | POA: Insufficient documentation

## 2023-04-04 DIAGNOSIS — I480 Paroxysmal atrial fibrillation: Secondary | ICD-10-CM | POA: Insufficient documentation

## 2023-04-04 DIAGNOSIS — G8929 Other chronic pain: Secondary | ICD-10-CM | POA: Insufficient documentation

## 2023-04-04 DIAGNOSIS — M25561 Pain in right knee: Secondary | ICD-10-CM | POA: Insufficient documentation

## 2023-04-04 DIAGNOSIS — L989 Disorder of the skin and subcutaneous tissue, unspecified: Secondary | ICD-10-CM | POA: Insufficient documentation

## 2023-04-04 DIAGNOSIS — M170 Bilateral primary osteoarthritis of knee: Secondary | ICD-10-CM | POA: Insufficient documentation

## 2023-04-04 MED ORDER — NYSTATIN 100,000 UNIT/GRAM TOPICAL CREAM
TOPICAL_CREAM | Freq: Two times a day (BID) | CUTANEOUS | 1 refills | Status: DC
Start: 2023-04-04 — End: 2023-05-22

## 2023-04-04 NOTE — Progress Notes (Unsigned)
 INTERNAL MEDICINE, [facility]  [address]  [place] [address]  Operated by [facility]      Name: Vanessa Tanner                       Date of Birth: January 03, 1956   MRN:  [id number]                         Date of visit: 04/04/2023     PCP: Samuella Cota, PA-C     Subjective  Vanessa Tanner is a 68 y.o. year old female who presents for Follow Up 4 Months (Ankle swelling     vaginal problems red )   to clinic.  No specialty comments available.   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     vein injection    Dr  Ladoris Gene      Vitamin D deficiency 06/30/2021      Current Outpatient Medications   Medication Sig    Amlodipine-Benazepril 5-40 mg Oral Capsule Take 1 Capsule by mouth Once a day    apixaban (ELIQUIS) 5 mg Oral Tablet Take by mouth Twice daily    calcium carbonate 600 mg calcium (1,500 mg) Oral Tablet Take 1 Tablet (600 mg total) by mouth Once a day    Cholecalciferol, Vitamin D3, 25 mcg (1,000 unit) Oral Capsule Take 1 Capsule (1,000 Units total) by mouth Once a day    cyanocobalamin (VITAMIN B 12) 1,000 mcg Oral Tablet Take 1 Tablet (1,000 mcg total) by mouth Once a day    EPINEPHrine 0.3 mg/0.3 mL Injection Auto-Injector Inject 0.3 mL (0.3 mg total) into the muscle Once, as needed    magnesium oxide (MAG-OX) 400 mg Oral Tablet Take 1 Tablet (400 mg total) by mouth Once a day    metoprolol  succinate (TOPROL-XL) 25 mg Oral Tablet Sustained Release 24 hr Take 1 Tablet (25 mg total) by mouth Once a day    multivitamin with iron Oral Tablet Take 1 Tablet by mouth Once a day    psyllium husk 3.4 gram/5.4 gram Oral Powder Take by mouth            Co swelling in ankle no sob  no chest pain    Co vaginal area at opening  itching x 1 week and no topical x  vasaline                REVIEW OF SYSTEMS:   Review of Systems  General: No fever.  No chills.  No weight changes.  HEENT: No vision changes.  Cardiac: No chest pain. No palpitations.  No dizziness.  No light-headedness.  No near syncope.  Resp: No dyspnea at rest, no dyspnea on exertion; no cough or hemoptysis; no orthopnea or PND.  GI: No N/V. No melena.  No bright red blood per rectum.  Ext: No edema.  No claudication.  Neuro: No focal weakness.  No numbness.  All other ROS negative.      Objective: BP 138/80 (Site: Left Arm, Patient Position: Sitting)   Pulse 81   Ht 1.6 m (5\' 3" )   Wt 81.7 kg (180 lb 3.2 oz)   SpO2 96%   BMI 31.92 kg/m                PHYSICAL EXAM  Physical Exam  Gen: NAD. Alert.   HEENT: PERRL; conjunctivae clear. No JVD or carotid bruit.  Cardiac: RRR with normal S1, S2.   Lungs: Clear to auscultation bilaterally. No rales. No wheezing. No rhonchi.  Abdomen: Soft, non-tender.non-distended  nl bowel sounds    Extremities: No edema. No cyanosis. No clubbing.  Neurologic:  Grossly intact    Lab Results   Component Value Date    CHOLESTEROL 231 (H) 11/16/2022    HDLCHOL 115 11/16/2022    LDLCHOL 105 (H) 11/16/2022    TRIG 57 11/16/2022       COMPLETE BLOOD COUNT   Lab Results   Component Value Date    WBC 5.7 11/16/2022    WBC 5.7 11/16/2022    HGB 14.9 (H) 11/16/2022    HCT 43.9 (H) 11/16/2022    PLTCNT 211 11/16/2022       DIFFERENTIAL  Lab Results   Component Value Date    PMNS 78 (H) 11/16/2022    LYMPHOCYTES 19 (L) 11/16/2022    MONOCYTES 3 11/16/2022    EOSINOPHIL 1 03/09/2022    BASOPHILS 1 03/09/2022    BASOPHILS 0.00  03/09/2022    PMNABS 1.70 (L) 03/09/2022    LYMPHSABS 2.70 03/09/2022    EOSABS 0.10 03/09/2022    MONOSABS 0.50 03/09/2022        COMPREHENSIVE METABOLIC PANEL  Lab Results   Component Value Date    SODIUM 139 02/10/2023    POTASSIUM 4.4 02/10/2023    CHLORIDE 103 02/10/2023    CO2 27 02/10/2023    ANIONGAP 9 02/10/2023    BUN 13 02/10/2023    CREATININE 0.63 02/10/2023    GLUCOSENF 103 02/10/2023    GLUCOSE negative 03/09/2022    CALCIUM 10.1 02/10/2023    ALBUMIN 4.6 02/10/2023    TOTALPROTEIN 7.3 02/10/2023    ALKPHOS 67 02/10/2023    AST 17 02/10/2023    ALT 10 02/10/2023    GFR 97 02/10/2023                THYROID STIMULATING HORMONE  Lab Results   Component Value Date    TSH 0.553 11/16/2022        Lab Results   Component Value Date    HA1C 5.2 02/10/2023     Lab Results   Component Value Date    VITD 67 03/09/2022         No orders of the defined types were placed in this encounter.     Assessment & Plan  Paroxysmal A-fib (CMS HCC)    Skin lesion         Meds reviewed as well as labs.  Chart reviewed and updated.   Continue current treatment.  Keep follow-up appointment.   Vaccine hx reviewed.   Awaiting a Dr Lequita Halt  appt for  left knee    Pt went to therapy and  realized that injections not helping  and more weakness  To see Dr Lana Fish 3/18  and to discuss  bridging the Eliquis

## 2023-04-06 ENCOUNTER — Telehealth (INDEPENDENT_AMBULATORY_CARE_PROVIDER_SITE_OTHER): Payer: Self-pay | Admitting: Internal Medicine

## 2023-04-09 DIAGNOSIS — M170 Bilateral primary osteoarthritis of knee: Secondary | ICD-10-CM | POA: Insufficient documentation

## 2023-04-21 ENCOUNTER — Encounter (INDEPENDENT_AMBULATORY_CARE_PROVIDER_SITE_OTHER): Payer: Self-pay | Admitting: Internal Medicine

## 2023-04-21 ENCOUNTER — Telehealth (INDEPENDENT_AMBULATORY_CARE_PROVIDER_SITE_OTHER): Payer: Self-pay | Admitting: Internal Medicine

## 2023-04-21 MED ORDER — PREDNISONE 20 MG TABLET
20.0000 mg | ORAL_TABLET | Freq: Two times a day (BID) | ORAL | 0 refills | Status: AC
Start: 2023-04-21 — End: 2023-04-26

## 2023-04-21 MED ORDER — CYCLOBENZAPRINE 10 MG TABLET
10.0000 mg | ORAL_TABLET | Freq: Three times a day (TID) | ORAL | 0 refills | Status: DC
Start: 2023-04-21 — End: 2023-05-22

## 2023-04-21 NOTE — Telephone Encounter (Signed)
 Meds sent

## 2023-05-16 ENCOUNTER — Ambulatory Visit (INDEPENDENT_AMBULATORY_CARE_PROVIDER_SITE_OTHER): Admitting: Internal Medicine

## 2023-05-16 ENCOUNTER — Other Ambulatory Visit: Payer: Self-pay

## 2023-05-16 ENCOUNTER — Telehealth (INDEPENDENT_AMBULATORY_CARE_PROVIDER_SITE_OTHER): Payer: Self-pay | Admitting: Internal Medicine

## 2023-05-17 ENCOUNTER — Ambulatory Visit: Attending: Internal Medicine | Admitting: Internal Medicine

## 2023-05-17 ENCOUNTER — Encounter (INDEPENDENT_AMBULATORY_CARE_PROVIDER_SITE_OTHER): Payer: Self-pay | Admitting: Internal Medicine

## 2023-05-17 VITALS — HR 82

## 2023-05-17 DIAGNOSIS — M549 Dorsalgia, unspecified: Secondary | ICD-10-CM | POA: Insufficient documentation

## 2023-05-17 DIAGNOSIS — R1030 Lower abdominal pain, unspecified: Secondary | ICD-10-CM | POA: Insufficient documentation

## 2023-05-17 MED ORDER — PREDNISONE 10 MG TABLETS IN A DOSE PACK
ORAL_TABLET | ORAL | 1 refills | Status: DC
Start: 2023-05-17 — End: 2023-07-28

## 2023-05-17 NOTE — Nursing Note (Signed)
 Pt states has had back spasms x 1 month states that the flexeril  and prednisone  did help some pt states that sometimes when she has a bowel movement it helps relieve some of the issue not sure if there is something going on there. She did go today and get shots in her knees

## 2023-05-17 NOTE — Progress Notes (Signed)
 INTERNAL MEDICINE, BUILDING A  [address]  [place] [address]  Operated by [facility]  Progress Note    Name: Vanessa Tanner MRN:  [id number]   Date: 05/17/2023 DOB:  Apr 29, 1955 (67 y.o.)              Chief Complaint: Muscle Spasm (Pt states has had back spasms x 1 month states that the flexeril  and prednisone  did help some pt states that sometimes when she has a bowel movement it helps relieve some of the issue not sure if there is something going on there. She did go today and get shots in her knees)       HPI: Vanessa Tanner is a 68 y.o. female who complains of  right hip apin  and into groin   Co relief with bowel movements    Small bowel movements    Pt on metamucil      Woke up  and trying to get up   increase in pain    Pulling  covers pain   bending to pick up things   worse      Duration 1 mo worsening     Before this  8 hrs  cleaning   the mud from the [place] in [place]     Follow-up knee pain degenerative joint disease patient needs knee replacements she is going tripped later in the summer she got injections today so that will hold her all from getting replacements until the fall   I did ask the patient if she talk to them about this  pain that wraps around into her hip and groin she says no   Will plan to workup hips and back  Allergies:  Allergies   Allergen Reactions    Bee Venom Protein (Honey Bee)      wasps    Spironolactone Diarrhea     Fatigue  and diarrhea         Amlodipine -Benazepril  5-40 mg Oral Capsule, Take 1 Capsule by mouth Once a day  apixaban (ELIQUIS) 5 mg Oral Tablet, Take by mouth Twice daily  calcium carbonate 600 mg calcium (1,500 mg) Oral Tablet, Take 1 Tablet (600 mg total) by mouth Daily  Cholecalciferol, Vitamin D3, 25 mcg (1,000 unit) Oral Capsule, Take 1 Capsule (1,000 Units total) by mouth Daily  cyanocobalamin (VITAMIN B 12) 1,000 mcg Oral Tablet, Take 1 Tablet (1,000 mcg total) by mouth Daily  cyclobenzaprine  (FLEXERIL ) 10 mg Oral Tablet, Take 1  Tablet (10 mg total) by mouth Three times a day  EPINEPHrine 0.3 mg/0.3 mL Injection Auto-Injector, Inject 0.3 mL (0.3 mg total) into the muscle Once, as needed  magnesium oxide (MAG-OX) 400 mg Oral Tablet, Take 1 Tablet (400 mg total) by mouth Daily  metoprolol succinate (TOPROL-XL) 25 mg Oral Tablet Sustained Release 24 hr, Take 1 Tablet (25 mg total) by mouth Daily  multivitamin with iron Oral Tablet, Take 1 Tablet by mouth Daily  nystatin  (MYCOSTATIN ) 100,000 unit/gram Cream, Apply topically Twice daily  psyllium husk 3.4 gram/5.4 gram Oral Powder, Take by mouth    No facility-administered medications prior to visit.       Review of Systems -     OBJECTIVE:  Vitals:    05/17/23 1010   Pulse: 82   SpO2: 97%      Physical Exam     Right raising of leg some pain on  right  side     Some soreness will lower  palpation some  SI tenderness more on the right no groin pain upon palpation no greater or inferior trochanteric pain   Positive sciatic tenderness upon palpation on the right   Patient's alignment is off minimally    Some spasm pain with movement    ASSESSMENT:     ICD-10-CM    1. Acute back pain  M54.9       2. Inguinal pain, unspecified laterality  R10.30           Orders Placed This Encounter    Prednisone  10 mg Oral Tablets, Dose Pack        PLAN: Treatment per orders . Call or return to clinic prn if these symptoms worsen or fail to improve as anticipated.  Meds reviewed as well as labs.  Chart reviewed and updated.   Continue current treatment.  Keep follow-up appointment.     S/p inj in knees today and  replacement in future  Garret Kales     Patient did not think to talk to him about her back and hip may need to do x-rays hip him back if no better patient just had cortisone shots so will do prednisone  10 mg Dosepak   Good stretching was discussed    Tia Flowers, PA-C

## 2023-05-22 ENCOUNTER — Encounter (INDEPENDENT_AMBULATORY_CARE_PROVIDER_SITE_OTHER): Payer: Self-pay | Admitting: Internal Medicine

## 2023-05-27 ENCOUNTER — Other Ambulatory Visit (INDEPENDENT_AMBULATORY_CARE_PROVIDER_SITE_OTHER): Payer: Self-pay | Admitting: Internal Medicine

## 2023-05-27 ENCOUNTER — Ambulatory Visit (HOSPITAL_BASED_OUTPATIENT_CLINIC_OR_DEPARTMENT_OTHER)

## 2023-05-27 ENCOUNTER — Ambulatory Visit (INDEPENDENT_AMBULATORY_CARE_PROVIDER_SITE_OTHER): Payer: Self-pay | Admitting: Internal Medicine

## 2023-05-27 ENCOUNTER — Ambulatory Visit
Admission: RE | Admit: 2023-05-27 | Discharge: 2023-05-27 | Disposition: A | Discharge status: Home / Self Care | Source: Ambulatory Visit | Attending: Internal Medicine

## 2023-05-27 ENCOUNTER — Other Ambulatory Visit: Payer: Self-pay

## 2023-05-27 DIAGNOSIS — R109 Unspecified abdominal pain: Secondary | ICD-10-CM | POA: Insufficient documentation

## 2023-05-27 DIAGNOSIS — R102 Pelvic and perineal pain: Secondary | ICD-10-CM

## 2023-05-27 DIAGNOSIS — M549 Dorsalgia, unspecified: Secondary | ICD-10-CM

## 2023-05-27 DIAGNOSIS — K449 Diaphragmatic hernia without obstruction or gangrene: Secondary | ICD-10-CM

## 2023-05-27 LAB — CREATININE WITH EGFR
CREATININE: 0.79 mg/dL (ref 0.55–1.02)
ESTIMATED GFR: 81 mL/min/{1.73_m2} (ref >59)

## 2023-05-27 MED ORDER — IOHEXOL 350 MG IODINE/ML INTRAVENOUS SOLUTION
100.0000 mL | INTRAVENOUS | Status: AC
Start: 2023-05-27 — End: 2023-05-27
  Administered 2023-05-27: 100 mL via INTRAVENOUS

## 2023-05-27 MED ORDER — PANTOPRAZOLE 40 MG TABLET,DELAYED RELEASE
40.0000 mg | DELAYED_RELEASE_TABLET | Freq: Every morning | ORAL | 5 refills | Status: AC
Start: 2023-05-27 — End: 2023-06-26

## 2023-07-18 ENCOUNTER — Ambulatory Visit (INDEPENDENT_AMBULATORY_CARE_PROVIDER_SITE_OTHER): Payer: Self-pay | Admitting: Internal Medicine

## 2023-07-27 ENCOUNTER — Ambulatory Visit (HOSPITAL_BASED_OUTPATIENT_CLINIC_OR_DEPARTMENT_OTHER)
Admission: RE | Admit: 2023-07-27 | Discharge: 2023-07-27 | Disposition: A | Discharge status: Home / Self Care | Source: Ambulatory Visit | Attending: Internal Medicine | Admitting: Internal Medicine

## 2023-07-27 ENCOUNTER — Other Ambulatory Visit: Payer: Self-pay

## 2023-07-27 ENCOUNTER — Ambulatory Visit: Payer: Self-pay | Attending: Internal Medicine | Admitting: Internal Medicine

## 2023-07-27 ENCOUNTER — Ambulatory Visit (INDEPENDENT_AMBULATORY_CARE_PROVIDER_SITE_OTHER): Payer: Self-pay | Admitting: Internal Medicine

## 2023-07-27 ENCOUNTER — Encounter (INDEPENDENT_AMBULATORY_CARE_PROVIDER_SITE_OTHER): Payer: Self-pay | Admitting: Internal Medicine

## 2023-07-27 DIAGNOSIS — R0602 Shortness of breath: Secondary | ICD-10-CM | POA: Insufficient documentation

## 2023-07-27 DIAGNOSIS — Z13820 Encounter for screening for osteoporosis: Secondary | ICD-10-CM | POA: Insufficient documentation

## 2023-07-27 DIAGNOSIS — R079 Chest pain, unspecified: Secondary | ICD-10-CM | POA: Insufficient documentation

## 2023-07-27 DIAGNOSIS — Z1239 Encounter for other screening for malignant neoplasm of breast: Secondary | ICD-10-CM

## 2023-07-27 DIAGNOSIS — I480 Paroxysmal atrial fibrillation: Secondary | ICD-10-CM | POA: Insufficient documentation

## 2023-07-27 DIAGNOSIS — I2089 Other forms of angina pectoris: Secondary | ICD-10-CM | POA: Insufficient documentation

## 2023-07-27 DIAGNOSIS — M7551 Bursitis of right shoulder: Secondary | ICD-10-CM | POA: Insufficient documentation

## 2023-07-27 DIAGNOSIS — Z0000 Encounter for general adult medical examination without abnormal findings: Secondary | ICD-10-CM | POA: Insufficient documentation

## 2023-07-27 DIAGNOSIS — I10 Essential (primary) hypertension: Secondary | ICD-10-CM | POA: Insufficient documentation

## 2023-07-27 DIAGNOSIS — K449 Diaphragmatic hernia without obstruction or gangrene: Secondary | ICD-10-CM | POA: Insufficient documentation

## 2023-07-27 DIAGNOSIS — R9431 Abnormal electrocardiogram [ECG] [EKG]: Secondary | ICD-10-CM | POA: Insufficient documentation

## 2023-07-27 MED ORDER — METHYLPREDNISOLONE ACETATE 80 MG/ML SUSPENSION FOR INJECTION
80.0000 mg | INTRAMUSCULAR | Status: AC
Start: 2023-07-27 — End: 2023-07-27
  Administered 2023-07-27: 80 mg via INTRAMUSCULAR

## 2023-07-27 NOTE — Progress Notes (Unsigned)
 INTERNAL [facility]ICINE, [facility] A  [address]  [place] [address]  Operated by [facility]  Medicare Annual Wellness Visit    Name: Vanessa Tanner MRN:  [id number]   Date: 07/27/2023 Age: 68 y.o.       SUBJECTIVE:   Vanessa Tanner is a 68 y.o. female for presenting for Medicare Wellness exam.   I have reviewed and reconciled the medication list with the patient today.        07/27/2023     1:47 PM 07/12/2022    11:47 AM 06/30/2021     2:00 PM   Comprehensive Health Assessment-Adult   Do you wish to complete this form? Yes Yes Yes   During the past 4 weeks, how would you rate your health in general? Good Good Very Good   During the past 4 weeks, how much difficulty have you had doing your usual activities inside and outside your home because of medical or emotional problems? Some difficulty No difficulty at all No difficulty at all   During the past 4 weeks, was someone available to help you if you needed and wanted help? Yes, as much as I wanted Yes, as much as I wanted Yes, as much as I wanted   In the past year, how many times have you gone to the emergency department or been admitted to a hospital for a health problem? None None None   Are you generally satisfied with your sleep? Yes Yes Yes   Do you have enough money to buy things you need in everyday life, such as food, clothing, medicines, and housing? Yes, always Yes, always Yes, always   Can you get to places beyond walking distance without help?  (For example, can you drive your own car or travel alone on buses)? Yes Yes Yes   Do you fasten your seatbelt when you are in a car? Yes, usually Yes, usually Yes, usually   Do you exercise 20 minutes 3 or more days per week (such as walking, dancing, biking, mowing grass, swimming)? Yes, some of the time Yes, some of the time No, I usually don't exercise this much   How often do you eat food that is healthy (fruits, vegetables, lean meats) instead of unhealthy (sweets, fast food, junk food, fatty  foods)? Most of the time Most of the time Almost always   Have your parents, brothers or sisters had any of the following problems before the age of 42? (check all that apply)  Heart problems, or hardening of the arteries;High cholesterol Heart problems, or hardening of the arteries;Diabetes (sugar);Cancer   How often do you have trouble taking medicines the eay you are told to take them? I always take them as prescribed I always take them as prescribed    Do you need any help communicating with your doctors and nurses because of vision or hearing problems? No No No   During the past 12 months, have you experienced confusion or memory loss that is happening more often or is getting worse? No No No   Do you have one person you think of as your personal doctor (primary care provider or family doctor)? Yes Yes Yes   If you are seeing a Primary Care Provider (PCP) or family doctor. please list their name [facility] Ameah Chanda   Are you now also seeing any specialist physician(s) (such as eye doctor, foot doctor, skin doctor)? Yes Yes No   If you are seeing a specialist  for anything such as foot, eye, skin, etc.  please list their name(s) dr qazi   dr oye   pulm   derm     dr clark dr javed dr azzo dr clark in [place]    How confident are you that you can control or manage most of your health problems? Somewhat confident Very confident Very confident       I have reviewed and updated as appropriate the past medical, family and social history. 07/27/2023 as summarized below:  Past Medical History:   Diagnosis Date    Alcohol dependence syndrome     B12 deficiency     Depressive disorder     Displacement of cervical intervertebral disc without myelopathy     Essential hypertension     Facial lesion     GAD (generalized anxiety disorder)     Hyperglycemia     Hypomagnesemia     Knee pain, bilateral     Menopausal and postmenopausal disorder     Paroxysmal A-fib (CMS HCC)     RLS (restless legs  syndrome)     Varicose veins of lower extremity     vein injection    Dr  Janie    Vitamin D  deficiency     Vitamin D  deficiency      Past Surgical History:   Procedure Laterality Date    Dental surgery      Mouth surgery       Current Outpatient Medications   Medication Sig    Amlodipine -Benazepril  5-40 mg Oral Capsule Take 1 Capsule by mouth Once a day    apixaban (ELIQUIS) 5 mg Oral Tablet Take by mouth Twice daily    calcium carbonate 600 mg calcium (1,500 mg) Oral Tablet Take 1 Tablet (600 mg total) by mouth Daily    Cholecalciferol, Vitamin D3, 25 mcg (1,000 unit) Oral Capsule Take 1 Capsule (1,000 Units total) by mouth Daily    cyanocobalamin (VITAMIN B 12) 1,000 mcg Oral Tablet Take 1 Tablet (1,000 mcg total) by mouth Daily    EPINEPHrine 0.3 mg/0.3 mL Injection Auto-Injector Inject 0.3 mL (0.3 mg total) into the muscle Once, as needed    furosemide (LASIX) 40 mg Oral Tablet Take 1 Tablet (40 mg total) by mouth Daily    magnesium oxide (MAG-OX) 400 mg Oral Tablet Take 1 Tablet (400 mg total) by mouth Daily    metoprolol succinate (TOPROL-XL) 25 mg Oral Tablet Sustained Release 24 hr Take 1 Tablet (25 mg total) by mouth Daily    multivitamin with iron Oral Tablet Take 1 Tablet by mouth Daily    pantoprazole  (PROTONIX ) 40 mg Oral Tablet, Delayed Release (E.C.) Take 1 Tablet (40 mg total) by mouth Daily    Prednisone  10 mg Oral Tablets, Dose Pack Take as directed on dose pack    psyllium husk 3.4 gram/5.4 gram Oral Powder Take by mouth     Family Medical History:       Problem Relation (Age of Onset)    Arthritis-osteo Brother    CKD Sister    Coronary Artery Disease Brother, Brother    Diabetes Sister    Diabetes type II Sister, Brother    Drug Abuse Brother    HTN <20 y.o. Mother, Brother    Lung disease Father    Osteoporosis Mother    Parkinsons Disease Brother            Social History     Socioeconomic History    Marital status: Single  Tobacco Use    Smoking status: Former     Types: Cigarettes     Smokeless tobacco: Never   Vaping Use    Vaping status: Never Used   Substance and Sexual Activity    Alcohol use: Yes    Drug use: Never     Social Determinants of Health     Financial Resource Strain: Low Risk  (06/30/2021)    Financial Resource Strain     SDOH Financial: No   Transportation Needs: Low Risk  (06/30/2021)    Transportation Needs     SDOH Transportation: No   Social Connections: Low Risk  (06/30/2021)    Social Connections     SDOH Social Isolation: 5 or more times a week   Intimate Partner Violence: Low Risk  (06/30/2021)    Intimate Partner Violence     SDOH Domestic Violence: No   Housing Stability: Low Risk  (06/30/2021)    Housing Stability     SDOH Housing Situation: I have housing.     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   Care Team       PCP       Name Type Specialty Phone Number    Paticia Suzen LABOR, [place] Physician Assistant PHYSICIAN ASSISTANT [phone]              Care Team       No care team found                      Health Maintenance   Topic Date Due    Covid-19 Vaccine (6 - 2024-25 season) 08/15/2023 (Originally 09/19/2022)    Pneumococcal Vaccination, Age 32+ (1 of 1 - PCV) 05/16/2024 (Originally 11/21/2004)    Breast Cancer Screening  08/12/2024    Colonoscopy  06/06/2025    Adult Tdap-Td (2 - Td or Tdap) 02/18/2030    Osteoporosis screening  04/24/2030    Shingles Vaccine  Completed    Medicare Annual Wellness Visit - Calendar Year Insurers  Completed    Hepatitis C screening  Discontinued     Medicare Wellness Assessment   Medicare initial or wellness physical in the last year?: Yes  Advance Directives   Does patient have a living will or MPOA: Yes   Has patient provided [employer] with a copy?: No   Patient advised to bring in copy.: Yes           Activities of Daily Living   Do you need help with dressing, bathing, or walking?: No   Do you need help with shopping, housekeeping, medications, or finances?: No   Do you have rugs in hallways, broken steps, or poor lighting?: No   Do you have grab bars in your bathroom, non-slip strips in your tub, and hand rails on your stairs?: Yes   Cognitive Function Screen (1=Yes, 0=No)   What is you age?: Correct   What is the time to the nearest hour?: Correct   What is the year?: Correct   What is the name of this clinic?: Correct   Can the patient recognize two persons (the doctor, the nurse, home help, etc.)?: Correct   What  is the date of your birth? (day and month sufficient) : Correct   In what year did World War II end?: Correct   Who is the current president of the [place] ?: Correct   Count from 20 down to 1?: Correct   What address did I give you earlier?: Correct   Total Score: 10   Interpretation of Total Score: Greater than 6 Normal   Fall Risk Screen   Do you feel unsteady when standing or walking?: No  Do you worry about falling?: No  Have you fallen in the past year?: No   Depression Screen     Little interest or pleasure in doing things.: Not at all  Feeling down, depressed, or hopeless: Not at all  PHQ 2 Total: 0     Pain Score   Pain Score:   0 - No pain    Substance Use-Abuse Screening     Tobacco Use     In Past 12 MONTHS, how often have you used any tobacco product (for example, cigarettes, e-cigarettes, cigars, pipes, or smokeless tobacco)?: Never     Alcohol use     In the PAST 12 MONTHS, how often have you had 5 (men)/4 (women) or more drinks containing alcohol in one day?: Never     Prescription Drug Use     In the PAST 12 months, how often have you used any prescription medications just for the feeling, more than prescribed, or that were not prescribed for you? Prescriptions may include: opioids, benzodiazepines, medications for ADHD: Never           Illicit Drug Use   In the PAST 12  MONTHS, how often have you used any drugs, including marijuana, cocaine or crack, heroin, methamphetamine, hallucinogens, ecstasy/MDMA?: Never            Urine Incontinence Screen   Urinary Incontinence Screen  Do you ever leak urine when you don't want to?: No                      OBJECTIVE:   BP 120/76 (Site: Left Arm, Patient Position: Sitting)   Pulse 77   Ht 1.6 m (5' 3)   Wt 81.3 kg (179 lb 3.2 oz)   SpO2 99%   BMI 31.74 kg/m        Other appropriate exam:  Exam-AMB  Const  General: cooperative, healthy appearing and no acute distress  Orientation/Consciousness: patient oriented x3  HENMT  Ears: hearing grossly normal bilaterally  Eyes  General: appearance normal, both eyes and all related structures  Conjunctivae: conjunctivae normal  Sclera: sclerae normal  EOM: EOM intact bilaterally  Neck  Neck: normal visual inspection and no lymphadenopathy  Thyroid : thyroid  normal  Carotids: no bruits  Resp  Effort & Inspection: normal respiratory effort  Auscultation: clear to auscultation bilaterally, no crackles, no rales, no rhonchi and no wheezes  Cardio  Jugular venous pressure: no JVD  Rate: regular rate  Rhythm: regular rhythm  Heart Sounds: S1 normal, S2 normal, no click, no murmurs and no rubs  Bruits: no carotid bruits  GI  Inspection: Yes normal to inspection  Palpation: soft, no hepatosplenomegaly, no guarding, no masses and nontender  Auscultation: normal bowel sounds  Neuro  General: patient oriented x3  Extrem  General: normal to inspection, normal exam except as noted, clubbing, cyanosis or edema noted, normal gait and other  Psych  Mental Status: mental status grossly normal  Mood: congruent mood  Affect: normal affect  Insight: insight good  Judgment: judgment good       There are no preventive care reminders to display for this patient.     ASSESSMENT & PLAN:   Assessment/Plan   1. Encounter for screening for malignant neoplasm of breast, unspecified screening modality    2. Medicare annual  wellness visit, subsequent       Identified Risk Factors/ Recommended Actions       The PHQ 2 Total: 0 depression screen is interpreted as negative.        Patient declined Advanced Directives information.        No orders of the defined types were placed in this encounter.       The patient has been educated about risk factors and recommended preventive care. Written Prevention Plan completed/ updated and given to patient (see After Visit Summary).    Pt has been to Dr Janie   Dr Lazarus  [facility]  on EGD and Colonoscopy   [facility]       Suzen DELENA Asa, PA-C

## 2023-07-27 NOTE — Patient Instructions (Signed)
 Medicare Preventive Services  Medicare coverage information Recommendation for YOU   Heart Disease and Diabetes   Lipid profile Every 5 years or more often if at risk for cardiovascular disease     Lab Results   Component Value Date    CHOLESTEROL 231 (H) 11/16/2022    HDLCHOL 115 11/16/2022    LDLCHOL 105 (H) 11/16/2022    TRIG 57 11/16/2022         Diabetes Screening    Yearly for those at risk for diabetes, 2 tests per year for those with prediabetes Last Glucose: 103    Diabetes Self Management Training or Medical Nutrition Therapy  For those with diabetes, up to 10 hrs initial training within a year, subsequent years up to 2 hrs of follow up training Optional for those with diabetes     Medical Nutrition Therapy  Three hours of one-on-one counseling in first year, two hours in subsequent years Optional for those with diabetes, kidney disease   Intensive Behavioral Therapy for Obesity  Face-to-face counseling, first month every week, month 2-6 every other week, month 7-12 every month if continued progress is documented Optional for those with Body Mass Index 30 or higher  Your Body mass index is 31.74 kg/m.   Tobacco Cessation (Quitting) Counseling   Covers up to 8 smoking and tobacco-use cessation counseling sessions in a 41-month period.    Optional for those that use tobacco   Cancer Screening Last Completion Date   Colorectal screening   For anyone age 54 to 30 or any age if high risk:  Screening Colonoscopy every 10 yrs if low risk,  more frequent if higher risk  OR  Cologuard Stool DNA test once every 3 years OR  Fecal Occult Blood Testing yearly OR  Flexible  Sigmoidoscopy  every 5 yr OR  CT Colonography every 5 yrs      See below for due date if applicable.   Screening Pap Test   Recommended every 3 years for all women age 45 to 59, or every five years if combined with HPV test (routine screening not needed after total hysterectomy).  Medicare covers every 2 years or yearly if high risk.  Screening  Pelvic Exam   Medicare covers every 2 years, yearly if high risk or childbearing age with abnormal Pap in last 3 yrs.     See below for due date if applicable.   Screening Mammogram   Recommended every 2 years for women age 41 to 49, or more frequent if you have a higher risk. Selectively recommended for women between 40-49 based on shared decisions about risk. Covered by Medicare up to every year for women age 35 or older --08/13/2022  See below for due date if applicable.         Lung Cancer Screening  Annual low dose computed tomography (LDCT scan) is recommended for those age 101-80 who smoked 20 pack-years and are current smokers or quit smoking within past 15 years, after counseling by your doctor or nurse clinician about the possible benefits or harms.     See below for due date if applicable.   Vaccinations   Respiratory syncytial virus (RSV)  Age 28 years or older: Based on shared clinical decision-making with your provider.  Pneumococcal Vaccine  Recommended routinely age 34+ with one or two separate vaccines based on your risk. Recommended before age 85 if medical conditions with increased risk  Seasonal Influenza Vaccine  Once every flu season   Hepatitis  B Vaccine  3 doses if risk (including anyone with diabetes or liver disease)  Shingles Vaccine  Two doses at age 79 or older  Diphtheria Tetanus Pertussis Vaccine  ONCE as adult, booster every 10 years     Immunization History   Administered Date(s) Administered   . DIPTH,PERTUSSIS-ACEL,TETANUS >10 YRS OLD 02/19/2020     Shingles vaccine and Diphtheria Tetanus Pertussis vaccines are available at pharmacies or local health department without a prescription.   Other Preventative Screening  Last Completion Date   Bone Densitometry   Screening: All females ages 52 and older every 10 years if initial screening normal. Postmenopausal women ages 78-64 need screening with one or more risk factor: previous fracture, parental hip fracture, current smoker, low body  weight, excessive alcohol use, Rheumatoid Arthritis   For women with diagnosed Osteoporosis, follow up is recommended every 2 years or a frequency recommended by your provider.     --04/23/2020  See below for due date if applicable.     Glaucoma Screening   Yearly if in high risk group such as diabetes, family history, African American age 46+ or Hispanic American age 22+   See your eye care provider for screening.   Hepatitis C Screening   Recommended  for those born between ages 18-79 years.     See below for due date if applicable.     HIV Testing  Recommended routinely at least ONCE, covered every year for age 41 to 39 regardless of risk, and every year for age over 29 who ask for the test or higher risk. Yearly or up to 3 times in pregnancy         See below for due date if applicable.   Abdominal Aortic Aneurysm Screening Ultrasound   Once with a family history of abdominal aortic aneurysms OR a female between65-75 and have smoked at least 100 cigarettes in your lifetime.         See below for due date if applicable.       Your Personalized Schedule for Preventive Tests   Health Maintenance: Pending and Last Completed       Date Due Completion Date    Covid-19 Vaccine (6 - 2024-25 season) 08/15/2023 (Originally 09/19/2022) 09/04/2021    Pneumococcal Vaccination, Age 61+ (1 of 1 - PCV) 05/16/2024 (Originally 11/21/2004) ---    Breast Cancer Screening 08/12/2024 08/13/2022    Colonoscopy 06/06/2025 ---    Adult Tdap-Td (2 - Td or Tdap) 02/18/2030 02/19/2020    Osteoporosis screening 04/24/2030 04/23/2020 (Done)    Override on 04/23/2020: Done                For Information on Advanced Directives for Health Care:  [place]:  [email or web address]  [place], [place], [place], [place] General Information: [email or web address]

## 2023-07-28 ENCOUNTER — Telehealth (INDEPENDENT_AMBULATORY_CARE_PROVIDER_SITE_OTHER): Payer: Self-pay | Admitting: Internal Medicine

## 2023-07-28 LAB — CBC WITH DIFF
BASOPHIL #: 0 x10ˆ3/uL (ref 0.00–0.10)
BASOPHIL %: 1 % (ref 0–1)
EOSINOPHIL #: 0.1 x10ˆ3/uL (ref 0.00–0.50)
EOSINOPHIL %: 2 % (ref 1–7)
HCT: 43.9 % — ABNORMAL HIGH (ref 31.2–41.9)
HGB: 15.1 g/dL — ABNORMAL HIGH (ref 10.9–14.3)
LYMPHOCYTE #: 1.3 x10ˆ3/uL (ref 1.10–3.10)
LYMPHOCYTE %: 29 % (ref 16–46)
MCH: 33.1 pg — ABNORMAL HIGH (ref 24.7–32.8)
MCHC: 34.3 g/dL (ref 32.3–35.6)
MCV: 96.6 fL — ABNORMAL HIGH (ref 75.5–95.3)
MONOCYTE #: 0.4 x10ˆ3/uL (ref 0.20–0.90)
MONOCYTE %: 8 % (ref 4–11)
MPV: 8.7 fL (ref 7.9–10.8)
NEUTROPHIL #: 2.8 x10ˆ3/uL (ref 1.90–8.20)
NEUTROPHIL %: 60 % (ref 43–77)
PLATELETS: 170 x10ˆ3/uL (ref 140–440)
RBC: 4.54 x10ˆ6/uL (ref 3.63–4.92)
RDW: 12.5 % (ref 12.3–17.7)
WBC: 4.7 x10ˆ3/uL (ref 3.8–11.8)

## 2023-07-28 LAB — COMPREHENSIVE METABOLIC PNL, FASTING
ALBUMIN/GLOBULIN RATIO: 1.9 — ABNORMAL HIGH (ref 0.8–1.4)
ALBUMIN: 4.9 g/dL (ref 3.5–5.7)
ALKALINE PHOSPHATASE: 70 U/L (ref 34–104)
ALT (SGPT): 11 U/L (ref 7–52)
ANION GAP: 9 mmol/L (ref 4–13)
AST (SGOT): 20 U/L (ref 13–39)
BILIRUBIN TOTAL: 0.7 mg/dL (ref 0.3–1.0)
BUN/CREA RATIO: 26 — ABNORMAL HIGH (ref 6–22)
BUN: 19 mg/dL (ref 7–25)
CALCIUM, CORRECTED: 9.7 mg/dL (ref 8.9–10.8)
CALCIUM: 10.4 mg/dL — ABNORMAL HIGH (ref 8.6–10.3)
CHLORIDE: 102 mmol/L (ref 98–107)
CO2 TOTAL: 28 mmol/L (ref 21–31)
CREATININE: 0.73 mg/dL (ref 0.60–1.30)
ESTIMATED GFR: 90 mL/min/1.73mˆ2 (ref >59)
GLOBULIN: 2.6 (ref 2.0–3.5)
GLUCOSE: 94 mg/dL (ref 74–109)
OSMOLALITY, CALCULATED: 280 mosm/kg (ref 270–290)
POTASSIUM: 4.1 mmol/L (ref 3.5–5.1)
PROTEIN TOTAL: 7.5 g/dL (ref 6.4–8.9)
SODIUM: 139 mmol/L (ref 136–145)

## 2023-07-28 LAB — MAGNESIUM: MAGNESIUM: 1.9 mg/dL (ref 1.9–2.7)

## 2023-07-28 LAB — LIPID PANEL
CHOL/HDL RATIO: 2.2
CHOLESTEROL: 235 mg/dL — ABNORMAL HIGH (ref <200)
HDL CHOL: 108 mg/dL (ref >=40)
LDL CALC: 116 mg/dL — ABNORMAL HIGH (ref 0–100)
TRIGLYCERIDES: 56 mg/dL (ref <=150)
VLDL CALC: 11 mg/dL (ref 0–50)

## 2023-07-28 LAB — B-TYPE NATRIURETIC PEPTIDE (BNP),PLASMA: BNP: 226 pg/mL — ABNORMAL HIGH (ref 1–100)

## 2023-07-28 LAB — THYROID STIMULATING HORMONE (SENSITIVE TSH): TSH: 1.138 u[IU]/mL (ref 0.450–5.330)

## 2023-08-04 ENCOUNTER — Other Ambulatory Visit (INDEPENDENT_AMBULATORY_CARE_PROVIDER_SITE_OTHER): Payer: Self-pay | Admitting: Internal Medicine

## 2023-08-09 ENCOUNTER — Encounter (HOSPITAL_COMMUNITY): Payer: Self-pay

## 2023-08-09 ENCOUNTER — Ambulatory Visit (HOSPITAL_BASED_OUTPATIENT_CLINIC_OR_DEPARTMENT_OTHER)
Admission: RE | Admit: 2023-08-09 | Discharge: 2023-08-09 | Disposition: A | Discharge status: Home / Self Care | Payer: Self-pay | Source: Ambulatory Visit | Attending: Internal Medicine | Admitting: Internal Medicine

## 2023-08-09 ENCOUNTER — Ambulatory Visit
Admission: RE | Admit: 2023-08-09 | Discharge: 2023-08-09 | Disposition: A | Discharge status: Home / Self Care | Payer: Self-pay | Source: Ambulatory Visit | Attending: Internal Medicine | Admitting: Internal Medicine

## 2023-08-09 ENCOUNTER — Other Ambulatory Visit: Payer: Self-pay

## 2023-08-09 DIAGNOSIS — R079 Chest pain, unspecified: Secondary | ICD-10-CM | POA: Insufficient documentation

## 2023-08-09 DIAGNOSIS — R0602 Shortness of breath: Secondary | ICD-10-CM | POA: Insufficient documentation

## 2023-08-09 DIAGNOSIS — I480 Paroxysmal atrial fibrillation: Secondary | ICD-10-CM | POA: Insufficient documentation

## 2023-08-09 DIAGNOSIS — R9431 Abnormal electrocardiogram [ECG] [EKG]: Secondary | ICD-10-CM | POA: Insufficient documentation

## 2023-08-09 DIAGNOSIS — I082 Rheumatic disorders of both aortic and tricuspid valves: Secondary | ICD-10-CM

## 2023-08-09 DIAGNOSIS — I2089 Other forms of angina pectoris: Secondary | ICD-10-CM | POA: Insufficient documentation

## 2023-08-09 LAB — TRANSTHORACIC ECHOCARDIOGRAM - ADULT: EF MEASUREMENT VALUE: 54.8

## 2023-08-09 MED ORDER — IOPAMIDOL 370 MG IODINE/ML (76 %) INTRAVENOUS SOLUTION
85.0000 mL | INTRAVENOUS | Status: AC
Start: 2023-08-09 — End: 2023-08-09
  Administered 2023-08-09: 85 mL via INTRAVENOUS

## 2023-08-09 MED ORDER — METOPROLOL TARTRATE 50 MG TABLET
ORAL_TABLET | ORAL | Status: AC
Start: 2023-08-09 — End: 2023-08-09
  Filled 2023-08-09: qty 1

## 2023-08-09 MED ORDER — METOPROLOL TARTRATE 50 MG TABLET
50.0000 mg | ORAL_TABLET | ORAL | Status: AC
Start: 2023-08-09 — End: 2023-08-09
  Administered 2023-08-09: 50 mg via ORAL

## 2023-08-09 MED ORDER — NITROGLYCERIN 0.4 MG SUBLINGUAL TABLET
0.8000 mg | SUBLINGUAL_TABLET | SUBLINGUAL | Status: AC
Start: 2023-08-09 — End: 2023-08-09
  Administered 2023-08-09: 0.8 mg via SUBLINGUAL

## 2023-08-09 MED ORDER — NITROGLYCERIN 0.4 MG SUBLINGUAL TABLET
SUBLINGUAL_TABLET | SUBLINGUAL | Status: AC
Start: 2023-08-09 — End: 2023-08-09
  Filled 2023-08-09: qty 1

## 2023-08-09 NOTE — Nurses Notes (Addendum)
 Patient for Cardiac CTA. Patient educated on procedure process, contrast, and medications. Patient encouraged to increase oral fluid intake post procedure. Understanding verbalized. Patient's initial blood pressure was 121/85. Patient's pulse was 71 and in A-fib. Dr. Zeb contacted and order given for Metoprolol  50 mg.     Patient's pulse was maintained in low 60's. Blood pressure was 152/86  and pulse was 66 in A-fib. HR was 55-65 due to A-fib: mostly staying around 55. Nitroglycerin  0.8mg  was administered for CTA.    Upon procedure completion, orthostatics obtained:    Lying: BP 141/96     Pulse   67  Sitting: BP 133/107   Pulse 65  Standing: BP 137/105   Pulse 65    Patient tolerated ambulation back to CTA holding area without difficulty. No complaints voiced. Patient transported to ECHO lab and IV remained in left [facility]- to be removed by ECHO staff.     Vital Signs at time of Discharge: BP: 132/100 Pulse:  65 RR:  16

## 2023-08-17 DIAGNOSIS — J9811 Atelectasis: Secondary | ICD-10-CM

## 2023-08-17 DIAGNOSIS — K449 Diaphragmatic hernia without obstruction or gangrene: Secondary | ICD-10-CM

## 2023-08-17 DIAGNOSIS — R0602 Shortness of breath: Secondary | ICD-10-CM

## 2023-08-17 DIAGNOSIS — I480 Paroxysmal atrial fibrillation: Secondary | ICD-10-CM

## 2023-08-17 DIAGNOSIS — R9431 Abnormal electrocardiogram [ECG] [EKG]: Secondary | ICD-10-CM

## 2023-08-17 LAB — CTA HEART CORONARY
Biplane Simpson EF: 51 %
CT Ascending aorta: 3.4 cm
CT Sinus: 3.2 cm
LCX (AGATSTON SCORE): 0
LVIDS 2D: 0 cm
RCA (AGATSTON SCORE): 0
Total Agatston: 51

## 2023-08-18 ENCOUNTER — Other Ambulatory Visit (INDEPENDENT_AMBULATORY_CARE_PROVIDER_SITE_OTHER): Payer: Self-pay

## 2023-08-18 ENCOUNTER — Ambulatory Visit (HOSPITAL_COMMUNITY): Payer: Self-pay

## 2023-08-18 DIAGNOSIS — Z1239 Encounter for other screening for malignant neoplasm of breast: Secondary | ICD-10-CM

## 2023-08-18 DIAGNOSIS — Z13820 Encounter for screening for osteoporosis: Secondary | ICD-10-CM

## 2023-08-22 ENCOUNTER — Other Ambulatory Visit (INDEPENDENT_AMBULATORY_CARE_PROVIDER_SITE_OTHER): Payer: Self-pay

## 2023-08-29 ENCOUNTER — Other Ambulatory Visit: Payer: Self-pay

## 2023-08-30 ENCOUNTER — Ambulatory Visit (INDEPENDENT_AMBULATORY_CARE_PROVIDER_SITE_OTHER): Payer: Self-pay | Admitting: Internal Medicine

## 2023-08-30 ENCOUNTER — Ambulatory Visit: Payer: Self-pay | Attending: Internal Medicine | Admitting: Internal Medicine

## 2023-08-30 ENCOUNTER — Encounter (INDEPENDENT_AMBULATORY_CARE_PROVIDER_SITE_OTHER): Payer: Self-pay | Admitting: Internal Medicine

## 2023-08-30 DIAGNOSIS — E559 Vitamin D deficiency, unspecified: Secondary | ICD-10-CM | POA: Insufficient documentation

## 2023-08-30 DIAGNOSIS — R0602 Shortness of breath: Secondary | ICD-10-CM | POA: Insufficient documentation

## 2023-08-30 DIAGNOSIS — E538 Deficiency of other specified B group vitamins: Secondary | ICD-10-CM | POA: Insufficient documentation

## 2023-08-30 DIAGNOSIS — I10 Essential (primary) hypertension: Secondary | ICD-10-CM | POA: Insufficient documentation

## 2023-08-30 DIAGNOSIS — I2510 Atherosclerotic heart disease of native coronary artery without angina pectoris: Secondary | ICD-10-CM | POA: Insufficient documentation

## 2023-08-30 DIAGNOSIS — I480 Paroxysmal atrial fibrillation: Secondary | ICD-10-CM | POA: Insufficient documentation

## 2023-08-30 LAB — COMPREHENSIVE METABOLIC PANEL, NON-FASTING
ALBUMIN/GLOBULIN RATIO: 1.8 — ABNORMAL HIGH (ref 0.8–1.4)
ALBUMIN: 4.6 g/dL (ref 3.5–5.7)
ALKALINE PHOSPHATASE: 60 U/L (ref 34–104)
ALT (SGPT): 14 U/L (ref 7–52)
ANION GAP: 7 mmol/L (ref 4–13)
AST (SGOT): 17 U/L (ref 13–39)
BILIRUBIN TOTAL: 0.5 mg/dL (ref 0.3–1.0)
BUN/CREA RATIO: 23 — ABNORMAL HIGH (ref 6–22)
BUN: 16 mg/dL (ref 7–25)
CALCIUM, CORRECTED: 9.4 mg/dL (ref 8.9–10.8)
CALCIUM: 9.9 mg/dL (ref 8.6–10.3)
CHLORIDE: 107 mmol/L (ref 98–107)
CO2 TOTAL: 28 mmol/L (ref 21–31)
CREATININE: 0.69 mg/dL (ref 0.60–1.30)
ESTIMATED GFR: 94 mL/min/1.73mˆ2 (ref >59)
GLOBULIN: 2.5 (ref 2.0–3.5)
GLUCOSE: 85 mg/dL (ref 74–109)
OSMOLALITY, CALCULATED: 284 mosm/kg (ref 270–290)
POTASSIUM: 4.3 mmol/L (ref 3.5–5.1)
PROTEIN TOTAL: 7.1 g/dL (ref 6.4–8.9)
SODIUM: 142 mmol/L (ref 136–145)

## 2023-08-30 LAB — PARATHYROID HORMONE (PTH): PTH: 37.5 pg/mL (ref 12.0–88.0)

## 2023-08-30 MED ORDER — ATORVASTATIN 20 MG TABLET
20.0000 mg | ORAL_TABLET | Freq: Every evening | ORAL | 3 refills | Status: DC
Start: 2023-08-30 — End: 2023-11-30

## 2023-08-30 NOTE — Nursing Note (Signed)
 Follow up no problems  go over other dr appointments

## 2023-08-30 NOTE — Progress Notes (Signed)
 [facility]  [address]  [place] [address]  Operated by [facility]  History and Physical    Name: Vanessa Tanner MRN:  [id number]   Date: 08/30/2023 DOB:  19-Aug-1955 (68 y.o.)         Name: Vanessa Tanner                       Date of Birth: 12/08/1955   MRN:  [id number]                         Date of visit: 08/30/2023     PCP: Suzen DELENA Asa, PA-C     Subjective  Vanessa Tanner is a 68 y.o. year old female who presents for Follow Up (Follow up no problems  go over other dr appointments)   to clinic.  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) 03/14/2022    B12 deficiency 06/30/2021    Depressive disorder 06/30/2021    Displacement of cervical intervertebral disc without myelopathy 06/30/2021    Essential hypertension 06/30/2021    GAD (generalized anxiety disorder) 06/30/2021    Hyperglycemia 06/30/2021    Hypomagnesemia 06/30/2021    Knee pain, bilateral 06/30/2021    Menopausal and postmenopausal disorder 06/30/2021    Paroxysmal A-fib (CMS HCC) 06/30/2021    RLS (restless legs syndrome) 06/30/2021    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     vein injection    Dr  Janie      Vitamin D  deficiency 06/30/2021      Current Outpatient Medications   Medication Sig    Amlodipine -Benazepril  5-40 mg Oral Capsule Take 1 Capsule by mouth Once a day    apixaban (ELIQUIS) 5 mg Oral Tablet Take by mouth Twice daily    atorvastatin  (LIPITOR) 20 mg Oral Tablet Take 1 Tablet (20 mg total) by mouth Every evening    calcium carbonate 600 mg calcium (1,500 mg) Oral Tablet Take 1 Tablet (600 mg total) by mouth Daily    Cholecalciferol, Vitamin D3, 25 mcg (1,000 unit) Oral Capsule Take 1 Capsule (1,000 Units total) by mouth Daily    cyanocobalamin (VITAMIN B 12) 1,000 mcg Oral Tablet Take 1 Tablet (1,000 mcg total) by mouth Daily     EPINEPHrine 0.3 mg/0.3 mL Injection Auto-Injector Inject 0.3 mL (0.3 mg total) into the muscle Once, as needed    furosemide (LASIX) 40 mg Oral Tablet Take 1 Tablet (40 mg total) by mouth Daily    magnesium oxide (MAG-OX) 400 mg Oral Tablet Take 1 Tablet (400 mg total) by mouth Daily    metoprolol  succinate (TOPROL -XL) 25 mg Oral Tablet Sustained Release 24 hr Take 1 Tablet (25 mg total) by mouth Daily    pantoprazole  (PROTONIX ) 40 mg Oral Tablet, Delayed Release (E.C.) Take 1 Tablet (40 mg total) by mouth Daily    psyllium husk 3.4 gram/5.4 gram Oral Powder Take by mouth            FU Visit      Fu SOB with exertion  had fu with Cardiology  Dr Eluterio   and Pulmonary      Fu  GERD Moderate HH  and to fu with Dr Lazarus  symptoms fairly controlled  with medications  Pt knows that she can not over eat      Fu Edema swelling better  pt using lasix prn       Fu Afib no recent changes    Stable on current medications      Fu Abn EKG  chest pain and sob     Echo ordered and discussed        CT coronary inconclusive and pt to see Cardiology soon to review     Fu Mammogram and dexa all reviewed    Mammogram stable   Dexa  + Osteopenia  worse score -2.0     Fu Low Mg   pt is on supplement     Fu Mixed HLD  pt is on statin      Fu Essential HTN     pt BP is stable     Fu low Vit  D  continu e Vit D      Fu Low B12   continue B12 supplement            REVIEW OF SYSTEMS:   Review of Systems  Review of Systems have been reviewed    Objective: BP 130/76   Pulse 73   Temp 36.9 C (98.5 F)   Ht 1.6 m (5' 3)   Wt 80.3 kg (177 lb)   SpO2 97%   BMI 31.35 kg/m                PHYSICAL EXAM  Physical Exam  Gen: NAD. Alert.   Exam-AMB  Const  General: cooperative, healthy appearing and no acute distress  Orientation/Consciousness: patient oriented x3  Neck  Neck: normal visual inspection and no lymphadenopathy  Thyroid : thyroid  normal  Carotids: no bruits  Resp  Effort & Inspection: normal respiratory effort  Auscultation: clear  to auscultation bilaterally, no crackles, no rales, no rhonchi and no wheezes  Cardio  Jugular venous pressure: no JVD  Rate: regular rate  Rhythm: regular rhythm  Heart Sounds: S1 normal, S2 normal, no click, no murmurs and no rubs        Assessment & Plan  Hypercalcemia  Fu PTH and fasting calcium   Coronary artery disease, unspecified vessel or lesion type, unspecified whether angina present, unspecified whether native or transplanted heart    SOB (shortness of breath)    Calcium blood increased    Paroxysmal A-fib (CMS HCC)  Stable on Eliquis  5mg   bid    Keep fu with Dr Christin   Essential hypertension  BP stable  continue  Amlodipine /benazepril   5/40 mg  one  daily    Vitamin D  deficiency  Continue  vit D3  1000 daily    B12 deficiency  Continue  B12 oral tablet 100 mg daily   Hypomagnesemia  Continue on Mg ox  400 mg daily        Start Lipitor   20 mg one daily    Fu   6 weeks   Labs in fu  Continue lasix qod as she has been on  for now  Keep cardiology  vascular and pulmonary appts coming up   Calcium and PTH  ordered     Keep upcoming appts    All reports reviewed   and discussed  On the day of the encounter, a total of  50 minutes was spent on this patient encounter including review of historical information, examination, documentation and post-visit activities.

## 2023-09-04 NOTE — Assessment & Plan Note (Signed)
 Continue  B12 oral tablet 100 mg daily

## 2023-09-04 NOTE — Assessment & Plan Note (Signed)
 Stable on Eliquis  5mg   bid    Keep fu with Dr Lana Fish

## 2023-09-04 NOTE — Assessment & Plan Note (Signed)
 Continue  vit D3  1000 daily

## 2023-09-04 NOTE — Assessment & Plan Note (Signed)
 Continue on Mg ox  400 mg daily

## 2023-09-04 NOTE — Assessment & Plan Note (Signed)
 BP stable  continue  Amlodipine/benazepril  5/40 mg  one  daily

## 2023-09-05 ENCOUNTER — Other Ambulatory Visit (INDEPENDENT_AMBULATORY_CARE_PROVIDER_SITE_OTHER): Payer: Self-pay | Admitting: Internal Medicine

## 2023-09-05 MED ORDER — AMLODIPINE 5 MG-BENAZEPRIL 40 MG CAPSULE
1.0000 | ORAL_CAPSULE | Freq: Every day | ORAL | 3 refills | Status: DC
Start: 2023-09-05 — End: 2023-11-30

## 2023-09-21 ENCOUNTER — Telehealth (INDEPENDENT_AMBULATORY_CARE_PROVIDER_SITE_OTHER): Payer: Self-pay | Admitting: Internal Medicine

## 2023-09-21 ENCOUNTER — Other Ambulatory Visit (INDEPENDENT_AMBULATORY_CARE_PROVIDER_SITE_OTHER): Payer: Self-pay | Admitting: Internal Medicine

## 2023-09-21 DIAGNOSIS — R0602 Shortness of breath: Secondary | ICD-10-CM

## 2023-09-26 ENCOUNTER — Telehealth (INDEPENDENT_AMBULATORY_CARE_PROVIDER_SITE_OTHER): Payer: Self-pay | Admitting: Internal Medicine

## 2023-10-13 ENCOUNTER — Other Ambulatory Visit: Payer: Self-pay

## 2023-10-14 ENCOUNTER — Encounter (INDEPENDENT_AMBULATORY_CARE_PROVIDER_SITE_OTHER): Payer: Self-pay | Admitting: Internal Medicine

## 2023-10-14 ENCOUNTER — Ambulatory Visit (INDEPENDENT_AMBULATORY_CARE_PROVIDER_SITE_OTHER): Payer: Self-pay | Admitting: Internal Medicine

## 2023-10-14 ENCOUNTER — Ambulatory Visit: Payer: Self-pay | Attending: Internal Medicine | Admitting: Internal Medicine

## 2023-10-14 VITALS — BP 132/86 | HR 77 | Ht 63.0 in | Wt 182.4 lb

## 2023-10-14 DIAGNOSIS — E785 Hyperlipidemia, unspecified: Secondary | ICD-10-CM | POA: Insufficient documentation

## 2023-10-14 DIAGNOSIS — R0602 Shortness of breath: Secondary | ICD-10-CM | POA: Insufficient documentation

## 2023-10-14 DIAGNOSIS — K219 Gastro-esophageal reflux disease without esophagitis: Secondary | ICD-10-CM | POA: Insufficient documentation

## 2023-10-14 DIAGNOSIS — Z87891 Personal history of nicotine dependence: Secondary | ICD-10-CM

## 2023-10-14 DIAGNOSIS — I480 Paroxysmal atrial fibrillation: Secondary | ICD-10-CM | POA: Insufficient documentation

## 2023-10-14 DIAGNOSIS — K449 Diaphragmatic hernia without obstruction or gangrene: Secondary | ICD-10-CM | POA: Insufficient documentation

## 2023-10-14 DIAGNOSIS — I10 Essential (primary) hypertension: Secondary | ICD-10-CM | POA: Insufficient documentation

## 2023-10-14 LAB — COMPREHENSIVE METABOLIC PANEL, NON-FASTING
ALBUMIN/GLOBULIN RATIO: 1.8 — ABNORMAL HIGH (ref 0.8–1.4)
ALBUMIN: 4.5 g/dL (ref 3.5–5.7)
ALKALINE PHOSPHATASE: 68 U/L (ref 34–104)
ALT (SGPT): 15 U/L (ref 7–52)
ANION GAP: 8 mmol/L (ref 4–13)
AST (SGOT): 18 U/L (ref 13–39)
BILIRUBIN TOTAL: 0.7 mg/dL (ref 0.3–1.0)
BUN/CREA RATIO: 28 — ABNORMAL HIGH (ref 6–22)
BUN: 19 mg/dL (ref 7–25)
CALCIUM, CORRECTED: 9.6 mg/dL (ref 8.9–10.8)
CALCIUM: 10 mg/dL (ref 8.6–10.3)
CHLORIDE: 103 mmol/L (ref 98–107)
CO2 TOTAL: 29 mmol/L (ref 21–31)
CREATININE: 0.69 mg/dL (ref 0.60–1.30)
ESTIMATED GFR: 94 mL/min/1.73mˆ2 (ref >59)
GLOBULIN: 2.5 (ref 2.0–3.5)
GLUCOSE: 96 mg/dL (ref 74–109)
OSMOLALITY, CALCULATED: 282 mosm/kg (ref 270–290)
POTASSIUM: 3.9 mmol/L (ref 3.5–5.1)
PROTEIN TOTAL: 7 g/dL (ref 6.4–8.9)
SODIUM: 140 mmol/L (ref 136–145)

## 2023-10-14 LAB — LIPID PANEL
CHOL/HDL RATIO: 1.6
CHOLESTEROL: 181 mg/dL (ref <200)
HDL CHOL: 113 mg/dL (ref >=40)
LDL CALC: 59 mg/dL (ref 0–100)
TRIGLYCERIDES: 43 mg/dL (ref <=150)
VLDL CALC: 9 mg/dL (ref 0–50)

## 2023-10-14 LAB — CREATINE KINASE (CK), TOTAL, SERUM OR PLASMA: CREATINE KINASE: 121 U/L (ref 30–223)

## 2023-10-14 MED ORDER — AREXVY (PF) 120 MCG/0.5 ML IM SUSPENSION
0.5000 mL | INHALATION_SUSPENSION | Freq: Once | INTRAMUSCULAR | 0 refills | Status: AC
Start: 2023-10-14 — End: 2023-10-14

## 2023-10-14 NOTE — Progress Notes (Unsigned)
 [facility]  [address]  [place] [address]  Operated by [facility]      Name: Vanessa Tanner                       Date of Birth: 04-20-1955   MRN:  [id number]                         Date of visit: 10/14/2023     PCP: Suzen DELENA Asa, PA-C     Subjective  Vanessa Tanner is a 68 y.o. year old female who presents for Medication Check (Follow up on medication Lipitor and fasting blood work)   to clinic.  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) 03/14/2022    B12 deficiency 06/30/2021    Depressive disorder 06/30/2021    Displacement of cervical intervertebral disc without myelopathy 06/30/2021    Essential hypertension 06/30/2021    GAD (generalized anxiety disorder) 06/30/2021    Hyperglycemia 06/30/2021    Hypomagnesemia 06/30/2021    Knee pain, bilateral 06/30/2021    Menopausal and postmenopausal disorder 06/30/2021    Paroxysmal A-fib (CMS HCC) 06/30/2021    RLS (restless legs syndrome) 06/30/2021    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     vein injection    Dr  Janie      Vitamin D  deficiency 06/30/2021      Current Outpatient Medications   Medication Sig    Amlodipine -Benazepril  5-40 mg Oral Capsule Take 1 Capsule by mouth Daily    apixaban (ELIQUIS) 5 mg Oral Tablet Take by mouth Twice daily    atorvastatin  (LIPITOR) 20 mg Oral Tablet Take 1 Tablet (20 mg total) by mouth Every evening    calcium carbonate 600 mg calcium (1,500 mg) Oral Tablet Take 1 Tablet (600 mg total) by mouth Daily    Cholecalciferol, Vitamin D3, 25 mcg (1,000 unit) Oral Capsule Take 1 Capsule (1,000 Units total) by mouth Daily    cyanocobalamin (VITAMIN B 12) 1,000 mcg Oral Tablet Take 1 Tablet (1,000 mcg total) by mouth Daily    EPINEPHrine 0.3 mg/0.3 mL Injection Auto-Injector Inject 0.3 mL (0.3 mg total) into the muscle Once, as  needed    furosemide (LASIX) 40 mg Oral Tablet Take 1 Tablet (40 mg total) by mouth Daily    magnesium oxide (MAG-OX) 400 mg Oral Tablet Take 1 Tablet (400 mg total) by mouth Daily    metoprolol  succinate (TOPROL -XL) 25 mg Oral Tablet Sustained Release 24 hr Take 1 Tablet (25 mg total) by mouth Daily    pantoprazole  (PROTONIX ) 40 mg Oral Tablet, Delayed Release (E.C.) Take 1 Tablet (40 mg total) by mouth Daily    psyllium husk 3.4 gram/5.4 gram Oral Powder Take by mouth        FU Visit      Care team   Dr Christin    Dr Lazarus  GI    Nena Borrow PA-C   Veins  Dr LULLA Blanch Pulm     Since last visit  felt faintness one day  goes quickly She was sitting  at the time  Lasts few seconds   Pt had not ate anything with a few of them    Wed had one more pronounced  on day of taking lasix  40  Not taking regular    Pt to go to Lasix  20 mg     Seen by Dr LULLA patel pt was given airsupra  and  she has not tried it yet discussed   how to take it        Fu HLD  pt is newly on Lipitor and fu labs   Pt on asa daily      Fu SOB with exertion Edema   had fu with Cardiology  Dr Eluterio at first and he sent her to  Pulmonary    FU  PFT ordered   Pt originally sent to Dr Eluterio for  cardiac  clearance          Fu  GERD Moderate HH  and to fu with Dr Mikal  symptoms fairly controlled  with medications    Seen by again had test and constant reflux on scan  pt on Protonix  now     Pt has no symptoms   To fu with Dr Mikal in Nov      Fu Edema swelling better  pt using lasix prn   discussed 20 mg to start and try daily     Fu Afib no recent changes    Stable on current medications   Eliquis  bid   Pt following with Dr  Christin Lamb Abn EKG  chest pain and sob     Echo ordered and discussed        CT coronary inconclusive and pt to see Cardiology soon to review and felt pt is doing well     Fu Mammogram and dexa all reviewed    Mammogram stable   Dexa  + Osteopenia  worse score -2.0     Fu Low Mg   pt is on supplement     Fu Mixed HLD  pt is on statin       Fu Essential HTN     pt BP is stable     Fu low Vit  D  continu e Vit D      Fu Low B12   continue B12 supplement              REVIEW OF SYSTEMS:   Review of Systems  General: No fever.  No chills.  No weight changes.  HEENT: No vision changes.  Cardiac: No chest pain. No palpitations.  No dizziness.  No light-headedness.  No near syncope.  Resp: No dyspnea at rest, no dyspnea on exertion; no cough or hemoptysis; no orthopnea or PND.  GI: No N/V. No melena.  No bright red blood per rectum.  Ext: No edema.  No claudication.  Neuro: No focal weakness.  No numbness.  All other ROS negative.      Objective: BP 132/86 (Site: Left Arm, Patient Position: Sitting)   Pulse 77   Ht 1.6 m (5' 3)   Wt 82.7 kg (182 lb 6.4 oz)   SpO2 96%   BMI 32.31 kg/m                PHYSICAL EXAM  Physical Exam  Gen: NAD. Alert.   HEENT: PERRL; conjunctivae clear. No JVD or carotid bruit.  Cardiac: RRR with normal S1, S2.   Lungs: Clear to auscultation bilaterally. No rales. No wheezing. No rhonchi.  Abdomen: Soft, non-tender.non-distended  nl bowel sounds    Extremities: No edema. No cyanosis. No clubbing.  Neurologic:  Grossly intact    Lab Results   Component Value Date    CHOLESTEROL 235 (H) 07/27/2023    HDLCHOL 108 07/27/2023    LDLCHOL 116 (H) 07/27/2023    TRIG 56 07/27/2023       COMPLETE BLOOD COUNT   Lab Results   Component Value Date    WBC 4.7 07/27/2023    HGB 15.1 (H) 07/27/2023    HCT 43.9 (H) 07/27/2023    PLTCNT 170 07/27/2023       DIFFERENTIAL  Lab Results   Component Value Date    PMNS 60 07/27/2023    LYMPHOCYTES 29 07/27/2023    MONOCYTES 8 07/27/2023    EOSINOPHIL 2 07/27/2023    BASOPHILS 1 07/27/2023    BASOPHILS 0.00 07/27/2023    PMNABS 2.80 07/27/2023    LYMPHSABS 1.30 07/27/2023    EOSABS 0.10 07/27/2023    MONOSABS 0.40 07/27/2023        COMPREHENSIVE METABOLIC PANEL  Lab Results   Component Value Date    SODIUM 142 08/30/2023    POTASSIUM 4.3 08/30/2023    CHLORIDE 107 08/30/2023    CO2 28 08/30/2023     ANIONGAP 7 08/30/2023    BUN 16 08/30/2023    CREATININE 0.69 08/30/2023    GLUCOSENF 85 08/30/2023    GLUCOSE negative 03/09/2022    CALCIUM 9.9 08/30/2023    ALBUMIN 4.6 08/30/2023    TOTALPROTEIN 7.1 08/30/2023    ALKPHOS 60 08/30/2023    AST 17 08/30/2023    ALT 14 08/30/2023    GFR 94 08/30/2023                THYROID  STIMULATING HORMONE  Lab Results   Component Value Date    TSH 1.138 07/27/2023        Lab Results   Component Value Date    HA1C 5.2 02/10/2023     Lab Results   Component Value Date    VITD 67 03/09/2022         No orders of the defined types were placed in this encounter.     Assessment & Plan  Paroxysmal A-fib (CMS HCC)    Essential hypertension    Hyperlipidemia, unspecified hyperlipidemia type       To fu in Oct for knee injection    Pt is on chol medication and fu labs   To fu with  V Tobie   Meds reviewed as well as labs.  Chart reviewed and updated.   Continue current treatment.  Keep follow-up appointment.   Vaccine hx reviewed.

## 2023-10-15 DIAGNOSIS — K219 Gastro-esophageal reflux disease without esophagitis: Secondary | ICD-10-CM | POA: Insufficient documentation

## 2023-10-15 DIAGNOSIS — K449 Diaphragmatic hernia without obstruction or gangrene: Secondary | ICD-10-CM | POA: Insufficient documentation

## 2023-10-15 NOTE — Assessment & Plan Note (Signed)
 Continue Eliquis 5mg  bid j

## 2023-10-15 NOTE — Assessment & Plan Note (Signed)
 On Protonix   may need  Pepcid or bethanechol   but pt wo symptoms  now except for SOB

## 2023-10-15 NOTE — Assessment & Plan Note (Signed)
 On Protonix   may need  Pepcid or bethanechol   but pt wo symptoms  now except for SOB   Surgery is still an option   Keep GI follow up in Nov

## 2023-10-15 NOTE — Assessment & Plan Note (Signed)
 BP to be monitored

## 2023-11-30 ENCOUNTER — Other Ambulatory Visit: Payer: Self-pay

## 2023-11-30 ENCOUNTER — Ambulatory Visit: Payer: Self-pay | Attending: Internal Medicine | Admitting: Internal Medicine

## 2023-11-30 ENCOUNTER — Encounter (INDEPENDENT_AMBULATORY_CARE_PROVIDER_SITE_OTHER): Payer: Self-pay | Admitting: Internal Medicine

## 2023-11-30 VITALS — BP 138/88 | HR 79 | Temp 98.9°F | Ht 63.0 in | Wt 180.0 lb

## 2023-11-30 DIAGNOSIS — R0602 Shortness of breath: Secondary | ICD-10-CM | POA: Insufficient documentation

## 2023-11-30 DIAGNOSIS — M170 Bilateral primary osteoarthritis of knee: Secondary | ICD-10-CM | POA: Insufficient documentation

## 2023-11-30 DIAGNOSIS — M8580 Other specified disorders of bone density and structure, unspecified site: Secondary | ICD-10-CM | POA: Insufficient documentation

## 2023-11-30 DIAGNOSIS — K449 Diaphragmatic hernia without obstruction or gangrene: Secondary | ICD-10-CM | POA: Insufficient documentation

## 2023-11-30 DIAGNOSIS — I10 Essential (primary) hypertension: Secondary | ICD-10-CM | POA: Insufficient documentation

## 2023-11-30 DIAGNOSIS — I480 Paroxysmal atrial fibrillation: Secondary | ICD-10-CM | POA: Insufficient documentation

## 2023-11-30 DIAGNOSIS — Z7901 Long term (current) use of anticoagulants: Secondary | ICD-10-CM

## 2023-11-30 DIAGNOSIS — F32A Depression, unspecified: Secondary | ICD-10-CM | POA: Insufficient documentation

## 2023-11-30 DIAGNOSIS — K219 Gastro-esophageal reflux disease without esophagitis: Secondary | ICD-10-CM | POA: Insufficient documentation

## 2023-11-30 MED ORDER — ATORVASTATIN 20 MG TABLET
20.0000 mg | ORAL_TABLET | Freq: Every evening | ORAL | 3 refills | Status: AC
Start: 2023-11-30 — End: ?

## 2023-11-30 MED ORDER — AMLODIPINE 5 MG-BENAZEPRIL 40 MG CAPSULE
1.0000 | ORAL_CAPSULE | Freq: Every day | ORAL | 3 refills | Status: AC
Start: 2023-11-30 — End: ?

## 2023-11-30 MED ORDER — PANTOPRAZOLE 40 MG TABLET,DELAYED RELEASE
40.0000 mg | DELAYED_RELEASE_TABLET | Freq: Every day | ORAL | 3 refills | Status: AC
Start: 2023-11-30 — End: ?

## 2023-11-30 MED ORDER — PNEUMOCOCCAL 20-VALENT CONJ VACCINE-DIP CRM (PF) 0.5 ML IM SYRINGE
0.5000 mL | INJECTION | Freq: Once | INTRAMUSCULAR | 0 refills | Status: AC
Start: 2023-11-30 — End: 2023-11-30

## 2023-11-30 NOTE — Progress Notes (Unsigned)
 [facility]  [address]  [place] [address]  Operated by [facility]      Name: Vanessa Tanner                       Date of Birth: 11-30-1955   MRN:  [id number]                         Date of visit: 11/30/2023     PCP: Suzen DELENA Asa, PA-C     Subjective  Vanessa Tanner is a 68 y.o. year old female who presents for Follow Up 4 Months (4 month follow up no problems)   to clinic.  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) 03/14/2022    B12 deficiency 06/30/2021    Depressive disorder 06/30/2021    Displacement of cervical intervertebral disc without myelopathy 06/30/2021    Essential hypertension 06/30/2021    GAD (generalized anxiety disorder) 06/30/2021    Hyperglycemia 06/30/2021    Hypomagnesemia 06/30/2021    Knee pain, bilateral 06/30/2021    Menopausal and postmenopausal disorder 06/30/2021    Paroxysmal A-fib (CMS HCC) 06/30/2021    RLS (restless legs syndrome) 06/30/2021    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     vein injection    Dr  Janie      Vitamin D  deficiency 06/30/2021      Current Outpatient Medications   Medication Sig    Amlodipine -Benazepril  5-40 mg Oral Capsule Take 1 Capsule by mouth Daily    apixaban (ELIQUIS) 5 mg Oral Tablet Take by mouth Twice daily    atorvastatin  (LIPITOR) 20 mg Oral Tablet Take 1 Tablet (20 mg total) by mouth Every evening    calcium carbonate 600 mg calcium (1,500 mg) Oral Tablet Take 1 Tablet (600 mg total) by mouth Daily    Cholecalciferol, Vitamin D3, 25 mcg (1,000 unit) Oral Capsule Take 1 Capsule (1,000 Units total) by mouth Daily    cyanocobalamin (VITAMIN B 12) 1,000 mcg Oral Tablet Take 1 Tablet (1,000 mcg total) by mouth Daily    EPINEPHrine 0.3  mg/0.3 mL Injection Auto-Injector Inject 0.3 mL (0.3 mg total) into the muscle Once, as needed    furosemide (LASIX) 40 mg Oral Tablet Take 1 Tablet (40 mg total) by mouth Daily    magnesium oxide (MAG-OX) 400 mg Oral Tablet Take 1 Tablet (400 mg total) by mouth Daily    metoprolol  succinate (TOPROL -XL) 25 mg Oral Tablet Sustained Release 24 hr Take 1 Tablet (25 mg total) by mouth Daily    pantoprazole  (PROTONIX ) 40 mg Oral Tablet, Delayed Release (E.C.) Take 1 Tablet (40 mg total) by mouth Daily    psyllium husk 3.4 gram/5.4 gram Oral Powder Take by mouth        Fu Visit     Pt retired Oct 31st after 52 yrs 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      Care team   Dr Christin    Dr Lazarus  GI    Nena Borrow PA-C   Veins  Dr LULLA Blanch Pulm     Since last visit  felt faintness one day  goes quickly She was sitting  at the time  Lasts few seconds   Pt had not ate anything with a few of them    Wed had one more pronounced on day of taking lasix  40  Not taking regular    Pt to go to Lasix  20 mg daily as trial for week than may add second if needed  If at anytime pt has near syncope eopisode she needs to call  Pt  was seen by cardiology   Seen by Dr LULLA patel pt was given airsupra  and  she has not tried it yet   Discussed how to take then inhaler       Fu HLD  pt is newly on Lipitor and fu labs   Pt on asa daily      Fu SOB with exertion Edema   had fu with Cardiology  Dr Eluterio at first and he sent her to  Pulmonary    FU  PFT ordered   Pt originally sent to Dr Eluterio for  cardiac  clearance          Fu  GERD Moderate HH  and to fu with Dr Mikal  symptoms fairly controlled  with medications    Seen by again had test and constant reflux on scan  pt on Protonix  now     Pt has no symptoms   To fu with Dr Mikal in Nov      Fu Edema swelling better  pt using lasix prn   discussed 20 mg to start and try daily     Fu Afib no recent changes    Stable on current medications   Eliquis  bid   Pt  following with cardiology       Fu Mammogram and dexa all reviewed    Mammogram stable   Dexa  + Osteopenia  worse score -2.0     Fu Low Mg   pt is on supplement     Fu Mixed HLD  pt is on statin      Fu Essential HTN     pt BP is stable     Fu low Vit  D  continu e Vit D      Fu Low B12   continue B12 supplement                  REVIEW OF SYSTEMS:   Review of Systems  ROS  Const  Reports system reviewed and no additional complaints, except as documented  ENT  Reports system reviewed and no additional complaints, except as documented  Resp  Reports system reviewed and no additional complaints, except as documented  GI  Reports system reviewed and no additional complaints, except as documented     Objective: BP 138/88   Pulse 79   Temp 37.2 C (98.9 F)   Ht 1.6 m (5' 3)   Wt 81.6 kg (180 lb)   SpO2 96%   BMI 31.89 kg/m                PHYSICAL EXAM  Physical Exam  Gen: NAD. Alert.   HEENT: PERRL; conjunctivae clear. No JVD or carotid bruit.  Cardiac: RRR with normal S1, S2.   Lungs: Clear to auscultation bilaterally. No rales. No wheezing. No rhonchi.  Abdomen: Soft, non-tender.non-distended  nl bowel sounds    Extremities: No  edema. No cyanosis. No clubbing.  Neurologic:  Grossly intact    Lab Results   Component Value Date    CHOLESTEROL 181 10/14/2023    HDLCHOL 113 10/14/2023    LDLCHOL 59 10/14/2023    TRIG 43 10/14/2023       COMPLETE BLOOD COUNT   Lab Results   Component Value Date    WBC 4.7 07/27/2023    HGB 15.1 (H) 07/27/2023    HCT 43.9 (H) 07/27/2023    PLTCNT 170 07/27/2023       DIFFERENTIAL  Lab Results   Component Value Date    PMNS 60 07/27/2023    LYMPHOCYTES 29 07/27/2023    MONOCYTES 8 07/27/2023    EOSINOPHIL 2 07/27/2023    BASOPHILS 1 07/27/2023    BASOPHILS 0.00 07/27/2023    PMNABS 2.80 07/27/2023    LYMPHSABS 1.30 07/27/2023    EOSABS 0.10 07/27/2023    MONOSABS 0.40 07/27/2023        COMPREHENSIVE METABOLIC PANEL  Lab Results   Component Value Date    SODIUM 140 10/14/2023     POTASSIUM 3.9 10/14/2023    CHLORIDE 103 10/14/2023    CO2 29 10/14/2023    ANIONGAP 8 10/14/2023    BUN 19 10/14/2023    CREATININE 0.69 10/14/2023    GLUCOSENF 96 10/14/2023    GLUCOSE negative 03/09/2022    CALCIUM 10.0 10/14/2023    ALBUMIN 4.5 10/14/2023    TOTALPROTEIN 7.0 10/14/2023    ALKPHOS 68 10/14/2023    AST 18 10/14/2023    ALT 15 10/14/2023    GFR 94 10/14/2023          THYROID  STIMULATING HORMONE  Lab Results   Component Value Date    TSH 1.138 07/27/2023        Lab Results   Component Value Date    HA1C 5.2 02/10/2023     Lab Results   Component Value Date    VITD 67 03/09/2022            Assessment & Plan  Essential hypertension  BP stable  continue  Amlodipine /benazepril   5/40 mg  one  daily    Paroxysmal A-fib (CMS HCC)  Stable on Eliquis  5mg   bid    Keep fu with cardiology   Gastroesophageal reflux disease without esophagitis  Stable on Protonix  40 mg daily  Hiatal hernia  No recent exacerbated symptoms  Continue Protonix  40 mg  Osteoarthritis of both knees  Awaiting knee replacement  Depressive disorder    Hypomagnesemia  Continue on Mg ox  400 mg daily     Osteopenia, unspecified location  DEXA scan up-to-date  Shortness of breath  Patient did have some relief when she tried the air supra asked her to do it 2 to 3 times a day for at least 3-5 days       Patient has had a recent extensive cardiac workup which was negative  Continue Eliquis for atrial fibrillation  Airsupra trial for   2 puffs bid  trial if does well  will try Symbicort  or something similar      Cortisone  in  left knee  with  Beryl Mealing and planning  for knee replacement patient will let us  know the date she will need to schedule therapy as well as get clearance from Cardiology    Dr Lazarus to fu  on the 20th  for [facility]      Family history updated  Meds reviewed as well as labs.  Chart reviewed and updated.   Continue current treatment.  Keep follow-up appointment.   Vaccine hx reviewed.     I personally reviewed the  documentation of care provided by the certified physician assistant and I agree with her medical decision making.     Roxie Sar, MD

## 2023-11-30 NOTE — Nursing Note (Signed)
 4 month follow up no problems

## 2023-12-01 ENCOUNTER — Encounter (INDEPENDENT_AMBULATORY_CARE_PROVIDER_SITE_OTHER): Payer: Self-pay | Admitting: Internal Medicine

## 2023-12-04 ENCOUNTER — Encounter (INDEPENDENT_AMBULATORY_CARE_PROVIDER_SITE_OTHER): Payer: Self-pay | Admitting: Internal Medicine

## 2023-12-04 DIAGNOSIS — R0602 Shortness of breath: Secondary | ICD-10-CM | POA: Insufficient documentation

## 2023-12-04 DIAGNOSIS — M8580 Other specified disorders of bone density and structure, unspecified site: Secondary | ICD-10-CM | POA: Insufficient documentation

## 2023-12-04 NOTE — Assessment & Plan Note (Signed)
 No recent exacerbated symptoms  Continue Protonix  40 mg

## 2023-12-04 NOTE — Assessment & Plan Note (Signed)
 Patient did have some relief when she tried the air supra asked her to do it 2 to 3 times a day for at least 3-5 days

## 2023-12-04 NOTE — Assessment & Plan Note (Signed)
 Stable on Protonix 40 mg daily.

## 2023-12-04 NOTE — Assessment & Plan Note (Signed)
 Continue on Mg ox  400 mg daily

## 2023-12-04 NOTE — Assessment & Plan Note (Signed)
 DEXA scan up-to-date

## 2023-12-04 NOTE — Assessment & Plan Note (Signed)
 Awaiting knee replacement.

## 2023-12-04 NOTE — Assessment & Plan Note (Signed)
 BP stable  continue  Amlodipine/benazepril  5/40 mg  one  daily

## 2023-12-04 NOTE — Assessment & Plan Note (Signed)
 Stable on Eliquis  5mg   bid    Keep fu with cardiology

## 2024-03-29 ENCOUNTER — Ambulatory Visit (INDEPENDENT_AMBULATORY_CARE_PROVIDER_SITE_OTHER): Payer: Self-pay | Admitting: Internal Medicine

## 2024-07-30 ENCOUNTER — Ambulatory Visit (INDEPENDENT_AMBULATORY_CARE_PROVIDER_SITE_OTHER): Payer: Self-pay | Admitting: Internal Medicine
